# Patient Record
Sex: Male | Born: 1985 | Race: Asian | Hispanic: No | Marital: Married | Smoking: Current every day smoker
Health system: Southern US, Community
[De-identification: ages and names within clinical notes are randomized; demographics above are authoritative.]

---

## 2013-12-16 ENCOUNTER — Encounter (HOSPITAL_COMMUNITY): Payer: Self-pay | Admitting: Emergency Medicine

## 2013-12-16 ENCOUNTER — Emergency Department (HOSPITAL_COMMUNITY): Payer: Medicaid Other

## 2013-12-16 ENCOUNTER — Emergency Department (HOSPITAL_COMMUNITY)
Admission: EM | Admit: 2013-12-16 | Discharge: 2013-12-16 | Disposition: A | Discharge status: Home / Self Care | Payer: Medicaid Other | Attending: Emergency Medicine | Admitting: Emergency Medicine

## 2013-12-16 DIAGNOSIS — S61419A Laceration without foreign body of unspecified hand, initial encounter: Secondary | ICD-10-CM

## 2013-12-16 DIAGNOSIS — Y9351 Activity, roller skating (inline) and skateboarding: Secondary | ICD-10-CM | POA: Insufficient documentation

## 2013-12-16 DIAGNOSIS — W1809XA Striking against other object with subsequent fall, initial encounter: Secondary | ICD-10-CM | POA: Insufficient documentation

## 2013-12-16 DIAGNOSIS — S6990XA Unspecified injury of unspecified wrist, hand and finger(s), initial encounter: Secondary | ICD-10-CM | POA: Insufficient documentation

## 2013-12-16 DIAGNOSIS — Y929 Unspecified place or not applicable: Secondary | ICD-10-CM | POA: Insufficient documentation

## 2013-12-16 DIAGNOSIS — IMO0002 Reserved for concepts with insufficient information to code with codable children: Secondary | ICD-10-CM | POA: Insufficient documentation

## 2013-12-16 DIAGNOSIS — S61409A Unspecified open wound of unspecified hand, initial encounter: Secondary | ICD-10-CM | POA: Insufficient documentation

## 2013-12-16 DIAGNOSIS — S6992XA Unspecified injury of left wrist, hand and finger(s), initial encounter: Secondary | ICD-10-CM

## 2013-12-16 DIAGNOSIS — Z23 Encounter for immunization: Secondary | ICD-10-CM | POA: Insufficient documentation

## 2013-12-16 MED ORDER — TETANUS-DIPHTH-ACELL PERTUSSIS 5-2.5-18.5 LF-MCG/0.5 IM SUSP
0.5000 mL | Freq: Once | INTRAMUSCULAR | Status: AC
  Administered 2013-12-16: 0.5 mL via INTRAMUSCULAR
  Filled 2013-12-16: qty 0.5

## 2013-12-16 MED ORDER — LIDOCAINE-EPINEPHRINE 2 %-1:100000 IJ SOLN
20.0000 mL | Freq: Once | INTRAMUSCULAR | Status: DC
  Filled 2013-12-16: qty 20

## 2013-12-16 MED ORDER — HYDROCODONE-ACETAMINOPHEN 5-325 MG PO TABS
1.0000 | ORAL_TABLET | Freq: Once | ORAL | Status: AC
  Administered 2013-12-16: 1 via ORAL
  Filled 2013-12-16: qty 1

## 2013-12-16 MED ORDER — HYDROCODONE-ACETAMINOPHEN 5-325 MG PO TABS: 1.0000 | ORAL_TABLET | Freq: Four times a day (QID) | ORAL | Status: DC | PRN

## 2013-12-16 NOTE — ED Notes (Signed)
Patient taken to XR.

## 2013-12-16 NOTE — ED Notes (Signed)
PA at Bedside. 

## 2013-12-16 NOTE — ED Notes (Signed)
Pt was skating, tripped and fell down, top of left hand hit the pavement.  Pt is able to move hand, make fist.  Laceration and swelling to left knuckle, two sites.  Bleeding controlled.

## 2013-12-16 NOTE — Discharge Instructions (Signed)
Please allow laceration to dry for the first 12 hours. After that clean with soap and water daily. Dry and clean wound and apply Neosporin over suture line at least once daily. Take pain medication as needed. Followup at urgent care in one week for suture removal. Return sooner if you noticed any signs of infection.  Laceration Care, Adult A laceration is a cut or lesion that goes through all layers of the skin and into the tissue just beneath the skin. TREATMENT  Some lacerations may not require closure. Some lacerations may not be able to be closed due to an increased risk of infection. It is important to see your caregiver as soon as possible after an injury to minimize the risk of infection and maximize the opportunity for successful closure. If closure is appropriate, pain medicines may be given, if needed. The wound will be cleaned to help prevent infection. Your caregiver will use stitches (sutures), staples, wound glue (adhesive), or skin adhesive strips to repair the laceration. These tools bring the skin edges together to allow for faster healing and a better cosmetic outcome. However, all wounds will heal with a scar. Once the wound has healed, scarring can be minimized by covering the wound with sunscreen during the day for 1 full year. HOME CARE INSTRUCTIONS  For sutures or staples:  Keep the wound clean and dry.  If you were given a bandage (dressing), you should change it at least once a day. Also, change the dressing if it becomes wet or dirty, or as directed by your caregiver.  Wash the wound with soap and water 2 times a day. Rinse the wound off with water to remove all soap. Pat the wound dry with a clean towel.  After cleaning, apply a thin layer of the antibiotic ointment as recommended by your caregiver. This will help prevent infection and keep the dressing from sticking.  You may shower as usual after the first 24 hours. Do not soak the wound in water until the sutures are  removed.  Only take over-the-counter or prescription medicines for pain, discomfort, or fever as directed by your caregiver.  Get your sutures or staples removed as directed by your caregiver. For skin adhesive strips:  Keep the wound clean and dry.  Do not get the skin adhesive strips wet. You may bathe carefully, using caution to keep the wound dry.  If the wound gets wet, pat it dry with a clean towel.  Skin adhesive strips will fall off on their own. You may trim the strips as the wound heals. Do not remove skin adhesive strips that are still stuck to the wound. They will fall off in time. For wound adhesive:  You may briefly wet your wound in the shower or bath. Do not soak or scrub the wound. Do not swim. Avoid periods of heavy perspiration until the skin adhesive has fallen off on its own. After showering or bathing, gently pat the wound dry with a clean towel.  Do not apply liquid medicine, cream medicine, or ointment medicine to your wound while the skin adhesive is in place. This may loosen the film before your wound is healed.  If a dressing is placed over the wound, be careful not to apply tape directly over the skin adhesive. This may cause the adhesive to be pulled off before the wound is healed.  Avoid prolonged exposure to sunlight or tanning lamps while the skin adhesive is in place. Exposure to ultraviolet light in the first  year will darken the scar.  The skin adhesive will usually remain in place for 5 to 10 days, then naturally fall off the skin. Do not pick at the adhesive film. You may need a tetanus shot if:  You cannot remember when you had your last tetanus shot.  You have never had a tetanus shot. If you get a tetanus shot, your arm may swell, get red, and feel warm to the touch. This is common and not a problem. If you need a tetanus shot and you choose not to have one, there is a rare chance of getting tetanus. Sickness from tetanus can be serious. SEEK  MEDICAL CARE IF:   You have redness, swelling, or increasing pain in the wound.  You see a red line that goes away from the wound.  You have yellowish-white fluid (pus) coming from the wound.  You have a fever.  You notice a bad smell coming from the wound or dressing.  Your wound breaks open before or after sutures have been removed.  You notice something coming out of the wound such as wood or glass.  Your wound is on your hand or foot and you cannot move a finger or toe. SEEK IMMEDIATE MEDICAL CARE IF:   Your pain is not controlled with prescribed medicine.  You have severe swelling around the wound causing pain and numbness or a change in color in your arm, hand, leg, or foot.  Your wound splits open and starts bleeding.  You have worsening numbness, weakness, or loss of function of any joint around or beyond the wound.  You develop painful lumps near the wound or on the skin anywhere on your body. MAKE SURE YOU:   Understand these instructions.  Will watch your condition.  Will get help right away if you are not doing well or get worse. Document Released: 09/06/2005 Document Revised: 11/29/2011 Document Reviewed: 03/02/2011 [facility] Patient Information 2014 [place], [place].

## 2013-12-16 NOTE — ED Notes (Signed)
Placed Ice pack on left hand injury.

## 2013-12-16 NOTE — ED Provider Notes (Signed)
Medical screening examination/treatment/procedure(s) were performed by non-physician practitioner and as supervising physician I was immediately available for consultation/collaboration.   EKG Interpretation None       Elena Cothern, MD 12/16/13 1652 

## 2013-12-16 NOTE — ED Notes (Signed)
Suture cart ready at patient's room.

## 2013-12-16 NOTE — ED Provider Notes (Signed)
CSN: [id number]     Arrival date & time 12/16/13  1136 History  This chart was scribed for non-physician practitioner, Fayrene Helper, PA-C working with Doug Sou, MD by Greggory Stallion, ED scribe. This patient was seen in room TR06C/TR06C and the patient's care was started at 11:48 AM.   Chief Complaint  Patient presents with  . Hand Injury   The history is provided by the patient. No language interpreter was used.   HPI Comments: Logan Branch is a 28 y.o. male who presents to the Emergency Department complaining of left hand injury that occurred about one hour ago. He was skateboarding, fell and landed on the pavement on his left hand while it was inverted. Denies hitting his head or LOC. He has sudden onset left hand pain with associated mild swelling and abrasions. Rates pain 5/10. Pt is left hand dominant. Denies elbow pain, shoulder pain, wrist pain, knee pain, neck pain. Pt is unsure of when his last tetanus was. Denies prior injury to left hand.   No past medical history on file. No past surgical history on file. No family history on file. History  Substance Use Topics  . Smoking status: Not on file  . Smokeless tobacco: Not on file  . Alcohol Use: Not on file    Review of Systems  Musculoskeletal: Positive for arthralgias and joint swelling.  Skin: Positive for wound.  All other systems reviewed and are negative.   Allergies  Review of patient's allergies indicates not on file.  Home Medications  No current outpatient prescriptions on file.  BP 131/82  Pulse 68  Temp(Src) 98.7 F (37.1 C) (Oral)  Resp 16  SpO2 100%  Physical Exam  Nursing note and vitals reviewed. Constitutional: He is oriented to person, place, and time. He appears well-developed and well-nourished. No distress.  HENT:  Head: Normocephalic and atraumatic.  Eyes: EOM are normal.  Neck: Neck supple. No tracheal deviation present.  Cardiovascular: Normal rate.   Pulmonary/Chest: Effort normal.  No respiratory distress.  Musculoskeletal: Normal range of motion.  Left hand with three separate lacerations. First one is a 2 cm L-shaped laceration noted to the dorsum of the third MCP. Distal to that laceration is a 2 cm horizontal laceration near the MCP without joint involvement. 1 cm superficial laceration noted to the fifth MCP dorsally. No foreign object noted. Fingers with full ROM. Normal sensation. Brisk capillary refill. Pt is able to make a fist. Left wrist with normal flexion, extension, supination and pronation. No tenderness to the anatomical snuffbox.   Neurological: He is alert and oriented to person, place, and time.  Skin: Skin is warm and dry. No rash noted.  Psychiatric: He has a normal mood and affect. His behavior is normal.    ED Course  Procedures (including critical care time)  DIAGNOSTIC STUDIES: Oxygen Saturation is 100% on RA, normal by my interpretation.    COORDINATION OF CARE: 11:52 AM-Discussed treatment plan which includes xray, pain medication and laceration repair with pt at bedside and pt agreed to plan.   2:33 PM Xray neg for acute fx or fb.  Wound were irrigate thoroughly and sutured.  Appropriate wound care was discussed.  Close f/u for signs of infection.  Return in 7 days for sutures removal.  Pt request work note.    LACERATION REPAIR Performed by: Fayrene Helper Authorized byFayrene Helper Consent: Verbal consent obtained. Risks and benefits: risks, benefits and alternatives were discussed Consent given by: patient Patient identity confirmed: provided  demographic data Prepped and Draped in normal sterile fashion Wound explored  Laceration Location: 5th mcp left hand, dorsum, superficial  Laceration Length: 1cm  No Foreign Bodies seen or palpated  Anesthesia: local infiltration  Local anesthetic: lidocaine 2% w epinephrine  Anesthetic total: 1 ml  Irrigation method: syringe Amount of cleaning: standard  Skin closure: vicryl  5.0  Number of sutures: 2  Technique: simple interrupted  Patient tolerance: Patient tolerated the procedure well with no immediate complications.  LACERATION REPAIR Performed by: Fayrene HelperRAN,Dalayza Zambrana Authorized byFayrene Helper: Abdoulaye Drum Consent: Verbal consent obtained. Risks and benefits: risks, benefits and alternatives were discussed Consent given by: patient Patient identity confirmed: provided demographic data Prepped and Draped in normal sterile fashion Wound explored  Laceration Location: left hand, proximal to 3rd MCP, superficial  Laceration Length: 2cm  No Foreign Bodies seen or palpated  Anesthesia: local infiltration  Local anesthetic: lidocaine 2% w epinephrine  Anesthetic total: 2 ml  Irrigation method: syringe Amount of cleaning: standard  Skin closure: vicryl 5.0  Number of sutures: 5  Technique: simple interrupted  Patient tolerance: Patient tolerated the procedure well with no immediate complications.  LACERATION REPAIR Performed by: Fayrene HelperRAN,Karinna Beadles Authorized byFayrene Helper: Tabitha Tupper Consent: Verbal consent obtained. Risks and benefits: risks, benefits and alternatives were discussed Consent given by: patient Patient identity confirmed: provided demographic data Prepped and Draped in normal sterile fashion Wound explored  Laceration Location: left hand, distal to 3rd MCP dorsum, superficial  Laceration Length: 1cm  No Foreign Bodies seen or palpated  Anesthesia: local infiltration  Local anesthetic: lidocaine 2% w epinephrine  Anesthetic total: 2 ml  Irrigation method: syringe Amount of cleaning: standard  Skin closure: vicryl 5.0  Number of sutures: 3  Technique: simple interrupted  Patient tolerance: Patient tolerated the procedure well with no immediate complications.    Labs Review Labs Reviewed - No data to display Imaging Review Dg Hand Complete Left  12/16/2013   CLINICAL DATA:  Skateboard accident.  Hand laceration.  EXAM: LEFT HAND - COMPLETE  3+ VIEW  COMPARISON:  None.  FINDINGS: Soft tissue swelling and possible laceration over the posterior MCP region. No radiopaque foreign bodies. No underlying acute bony abnormality. No fracture, subluxation or dislocation. Joint spaces are maintained.  IMPRESSION: No acute bony abnormality.  No radiopaque foreign bodies.   Electronically Signed   By: Charlett NoseKevin  Dover M.D.   On: 12/16/2013 13:04     EKG Interpretation None      MDM   Final diagnoses:  Injury of left hand  Hand laceration    BP 131/82  Pulse 68  Temp(Src) 98.7 F (37.1 C) (Oral)  Resp 16  SpO2 100%  I have reviewed nursing notes and vital signs. I personally reviewed the imaging tests through PACS system  I reviewed available ER/hospitalization records thought the EMR   I personally performed the services described in this documentation, which was scribed in my presence. The recorded information has been reviewed and is accurate.    Fayrene HelperBowie Estefano Victory, PA-C 12/16/13 1434

## 2013-12-16 NOTE — ED Notes (Signed)
Patient soaking hand in Povidone-Iodine Solution.

## 2016-09-06 ENCOUNTER — Encounter (HOSPITAL_COMMUNITY): Payer: Self-pay

## 2016-09-06 ENCOUNTER — Emergency Department (HOSPITAL_COMMUNITY)
Admission: EM | Admit: 2016-09-06 | Discharge: 2016-09-07 | Disposition: A | Discharge status: Other Acute Inpatient Hospital | Payer: BLUE CROSS/BLUE SHIELD | Attending: Emergency Medicine | Admitting: Emergency Medicine

## 2016-09-06 DIAGNOSIS — IMO0002 Reserved for concepts with insufficient information to code with codable children: Secondary | ICD-10-CM | POA: Diagnosis present

## 2016-09-06 DIAGNOSIS — F17210 Nicotine dependence, cigarettes, uncomplicated: Secondary | ICD-10-CM | POA: Insufficient documentation

## 2016-09-06 DIAGNOSIS — F1090 Alcohol use, unspecified, uncomplicated: Secondary | ICD-10-CM | POA: Diagnosis present

## 2016-09-06 DIAGNOSIS — F332 Major depressive disorder, recurrent severe without psychotic features: Secondary | ICD-10-CM | POA: Diagnosis present

## 2016-09-06 DIAGNOSIS — F1994 Other psychoactive substance use, unspecified with psychoactive substance-induced mood disorder: Secondary | ICD-10-CM | POA: Diagnosis present

## 2016-09-06 DIAGNOSIS — F1099 Alcohol use, unspecified with unspecified alcohol-induced disorder: Secondary | ICD-10-CM | POA: Insufficient documentation

## 2016-09-06 DIAGNOSIS — F1920 Other psychoactive substance dependence, uncomplicated: Secondary | ICD-10-CM | POA: Diagnosis present

## 2016-09-06 DIAGNOSIS — F19950 Other psychoactive substance use, unspecified with psychoactive substance-induced psychotic disorder with delusions: Secondary | ICD-10-CM | POA: Diagnosis present

## 2016-09-06 LAB — COMPREHENSIVE METABOLIC PANEL
ALBUMIN: 4.5 g/dL (ref 3.5–5.0)
ALK PHOS: 84 U/L (ref 38–126)
ALT: 19 U/L (ref 17–63)
AST: 27 U/L (ref 15–41)
Anion gap: 7 (ref 5–15)
BILIRUBIN TOTAL: 0.7 mg/dL (ref 0.3–1.2)
BUN: 14 mg/dL (ref 6–20)
CO2: 27 mmol/L (ref 22–32)
Calcium: 9.2 mg/dL (ref 8.9–10.3)
Chloride: 103 mmol/L (ref 101–111)
Creatinine, Ser: 0.9 mg/dL (ref 0.61–1.24)
GFR calc Af Amer: >60 mL/min (ref >60)
GFR calc non Af Amer: >60 mL/min (ref >60)
Glucose, Bld: 84 mg/dL (ref 65–99)
POTASSIUM: 3.8 mmol/L (ref 3.5–5.1)
Sodium: 137 mmol/L (ref 135–145)
TOTAL PROTEIN: 7.4 g/dL (ref 6.5–8.1)

## 2016-09-06 LAB — CBC
HCT: 46 % (ref 39.0–52.0)
Hemoglobin: 15.7 g/dL (ref 13.0–17.0)
MCH: 26.1 pg (ref 26.0–34.0)
MCHC: 34.1 g/dL (ref 30.0–36.0)
MCV: 76.5 fL — AB (ref 78.0–100.0)
Platelets: 285 10*3/uL (ref 150–400)
RBC: 6.01 MIL/uL — ABNORMAL HIGH (ref 4.22–5.81)
RDW: 13.5 % (ref 11.5–15.5)
WBC: 8.4 10*3/uL (ref 4.0–10.5)

## 2016-09-06 LAB — SALICYLATE LEVEL: Salicylate Lvl: <7 mg/dL (ref 2.8–30.0)

## 2016-09-06 LAB — ACETAMINOPHEN LEVEL: Acetaminophen (Tylenol), Serum: <10 ug/mL — ABNORMAL LOW (ref 10–30)

## 2016-09-06 LAB — ETHANOL: Alcohol, Ethyl (B): 21 mg/dL — ABNORMAL HIGH (ref <5)

## 2016-09-06 NOTE — ED Notes (Signed)
Patient belonging locked in locker 28, jacket , shirt , socks , shoe , pants , wallet
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
## 2016-09-06 NOTE — BH Assessment (Signed)
[facility] Assessment Progress Note   Could not get patient to wake up for assessment.  Will attempt again later.
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
## 2016-09-06 NOTE — ED Notes (Signed)
MD in Room with patient

## 2016-09-06 NOTE — Progress Notes (Signed)
Patient listed as having Medicaid insurance without a pcp. Patient has Medicaid [employer] insurance.  This insurance does not cover the cost of medications.  The following resources were placed in patient's belongings bag in locker #28:    [facility] with [facility], informed patient of services there  [facility] also provided patient with list of pcps who accept self pay patients, list of discount pharmacies and websites needymeds.org and GoodRX.com for medication assistance, phone number to inquire about the orange card, phone number to inquire about Medicaid, phone number to inquire about the Affordable Care Act, financial resources in the community such as local churches, [employer], urban ministries, and dental assistance for uninsured patients.  No further EDCM needs at this time.
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
## 2016-09-07 ENCOUNTER — Inpatient Hospital Stay (HOSPITAL_COMMUNITY)
Admission: AD | Admit: 2016-09-07 | Discharge: 2016-09-14 | DRG: 897 | Disposition: A | Discharge status: Home / Self Care | Payer: BLUE CROSS/BLUE SHIELD | Attending: Psychiatry | Admitting: Psychiatry

## 2016-09-07 ENCOUNTER — Encounter (HOSPITAL_COMMUNITY): Payer: Self-pay | Admitting: *Deleted

## 2016-09-07 DIAGNOSIS — R45850 Homicidal ideations: Secondary | ICD-10-CM | POA: Diagnosis present

## 2016-09-07 DIAGNOSIS — F332 Major depressive disorder, recurrent severe without psychotic features: Secondary | ICD-10-CM | POA: Diagnosis present

## 2016-09-07 DIAGNOSIS — F411 Generalized anxiety disorder: Secondary | ICD-10-CM | POA: Diagnosis present

## 2016-09-07 DIAGNOSIS — IMO0002 Reserved for concepts with insufficient information to code with codable children: Secondary | ICD-10-CM | POA: Diagnosis present

## 2016-09-07 DIAGNOSIS — F1290 Cannabis use, unspecified, uncomplicated: Secondary | ICD-10-CM | POA: Diagnosis present

## 2016-09-07 DIAGNOSIS — F17210 Nicotine dependence, cigarettes, uncomplicated: Secondary | ICD-10-CM | POA: Diagnosis present

## 2016-09-07 DIAGNOSIS — F1920 Other psychoactive substance dependence, uncomplicated: Secondary | ICD-10-CM | POA: Diagnosis present

## 2016-09-07 DIAGNOSIS — F19950 Other psychoactive substance use, unspecified with psychoactive substance-induced psychotic disorder with delusions: Secondary | ICD-10-CM | POA: Diagnosis present

## 2016-09-07 DIAGNOSIS — Z79899 Other long term (current) drug therapy: Secondary | ICD-10-CM | POA: Diagnosis not present

## 2016-09-07 DIAGNOSIS — F1099 Alcohol use, unspecified with unspecified alcohol-induced disorder: Secondary | ICD-10-CM | POA: Diagnosis present

## 2016-09-07 DIAGNOSIS — F1090 Alcohol use, unspecified, uncomplicated: Secondary | ICD-10-CM | POA: Diagnosis present

## 2016-09-07 DIAGNOSIS — F0630 Mood disorder due to known physiological condition, unspecified: Secondary | ICD-10-CM

## 2016-09-07 DIAGNOSIS — F1994 Other psychoactive substance use, unspecified with psychoactive substance-induced mood disorder: Secondary | ICD-10-CM | POA: Diagnosis present

## 2016-09-07 LAB — RAPID URINE DRUG SCREEN, HOSP PERFORMED
Amphetamines: NOT DETECTED
BARBITURATES: NOT DETECTED
BENZODIAZEPINES: NOT DETECTED
Cocaine: NOT DETECTED
Opiates: NOT DETECTED
Tetrahydrocannabinol: NOT DETECTED

## 2016-09-07 MED ORDER — ONDANSETRON HCL 4 MG PO TABS: 4.0000 mg | ORAL_TABLET | Freq: Three times a day (TID) | ORAL | Status: DC | PRN

## 2016-09-07 MED ORDER — IBUPROFEN 600 MG PO TABS: 600.0000 mg | ORAL_TABLET | Freq: Three times a day (TID) | ORAL | Status: DC | PRN

## 2016-09-07 MED ORDER — OLANZAPINE 5 MG PO TABS
5.0000 mg | ORAL_TABLET | Freq: Every day | ORAL | Status: DC
  Filled 2016-09-07: qty 1

## 2016-09-07 MED ORDER — OLANZAPINE 5 MG PO TABS
5.0000 mg | ORAL_TABLET | Freq: Every day | ORAL | Status: DC
  Administered 2016-09-07: 5 mg via ORAL
  Filled 2016-09-07 (×3): qty 1

## 2016-09-07 MED ORDER — ALUM & MAG HYDROXIDE-SIMETH 200-200-20 MG/5ML PO SUSP
30.0000 mL | ORAL | Status: DC | PRN
  Administered 2016-09-12: 30 mL via ORAL
  Filled 2016-09-07: qty 30

## 2016-09-07 MED ORDER — ACETAMINOPHEN 325 MG PO TABS
650.0000 mg | ORAL_TABLET | Freq: Four times a day (QID) | ORAL | Status: DC | PRN
  Administered 2016-09-10: 650 mg via ORAL
  Filled 2016-09-07: qty 2

## 2016-09-07 MED ORDER — MAGNESIUM HYDROXIDE 400 MG/5ML PO SUSP: 30.0000 mL | Freq: Every day | ORAL | Status: DC | PRN

## 2016-09-07 MED ORDER — ZOLPIDEM TARTRATE 5 MG PO TABS: 5.0000 mg | ORAL_TABLET | Freq: Every evening | ORAL | Status: DC | PRN

## 2016-09-07 MED ORDER — GABAPENTIN 100 MG PO CAPS
200.0000 mg | ORAL_CAPSULE | Freq: Two times a day (BID) | ORAL | Status: DC
  Administered 2016-09-07: 200 mg via ORAL
  Filled 2016-09-07: qty 2

## 2016-09-07 MED ORDER — NICOTINE 7 MG/24HR TD PT24
7.0000 mg | MEDICATED_PATCH | Freq: Every day | TRANSDERMAL | Status: DC
  Administered 2016-09-07: 7 mg via TRANSDERMAL
  Filled 2016-09-07: qty 1

## 2016-09-07 MED ORDER — GABAPENTIN 100 MG PO CAPS
200.0000 mg | ORAL_CAPSULE | Freq: Two times a day (BID) | ORAL | Status: DC
  Administered 2016-09-07: 200 mg via ORAL
  Filled 2016-09-07 (×5): qty 2

## 2016-09-07 MED ORDER — ALUM & MAG HYDROXIDE-SIMETH 200-200-20 MG/5ML PO SUSP: 30.0000 mL | ORAL | Status: DC | PRN

## 2016-09-07 MED ORDER — LORAZEPAM 1 MG PO TABS: 1.0000 mg | ORAL_TABLET | Freq: Three times a day (TID) | ORAL | Status: DC | PRN

## 2016-09-07 MED ORDER — ONDANSETRON HCL 4 MG PO TABS
4.0000 mg | ORAL_TABLET | Freq: Three times a day (TID) | ORAL | Status: DC | PRN
Start: 2016-09-07 — End: 2016-09-14

## 2016-09-07 MED ORDER — IBUPROFEN 200 MG PO TABS: 600.0000 mg | ORAL_TABLET | Freq: Three times a day (TID) | ORAL | Status: DC | PRN

## 2016-09-07 MED ORDER — ACETAMINOPHEN 325 MG PO TABS: 650.0000 mg | ORAL_TABLET | ORAL | Status: DC | PRN

## 2016-09-07 NOTE — Consult Note (Signed)
[place] Psychiatry Consult   Reason for Consult:  Psychiatric evaluation Referring Physician:  EDP Patient Identification: Logan Branch MRN:  [id number] Principal Diagnosis: Substance-induced psychotic disorder with delusions [facility]) Diagnosis:   Patient Active Problem List   Diagnosis Date Noted  . Substance-induced psychotic disorder with delusions [facility]) [F19.950] 09/07/2016    Priority: High  . Substance induced mood disorder ([place]) [F19.94] 09/07/2016    Priority: High  . Alcohol use disorder ([place]) [F10.99] 09/07/2016  . Polysubstance (excluding opioids) dependence ([place]) [F19.20] 09/07/2016    Total Time spent with patient: 45 minutes  Subjective:   Logan Branch is a 30 y.o. male patient admitted with psychosis and aggressive behavior.  HPI:  Patient denies prior history of mental illness but reports 10 year history of Marijuana/Alcohol use and 3 months history of Methamphetamine abuse. He was brought to [facility] under IVC paper activated by his father in law after he became hostile, aggressive, paranoid and hearing voices telling him to kill his stepfather and mother. Patient reports inability to control his behavior when he is under the influence of drugs. He states that he is paranoid and hears voices whenever he is "high" on Methamphetamine. Patient reports that he used Meth and THC few days ago but drank some alcohol yesterday.   Past Psychiatric History: denies  Risk to Self: Suicidal Ideation: No Suicidal Intent: No Is patient at risk for suicide?: No Suicidal Plan?: No Access to Means: No What has been your use of drugs/alcohol within the last 12 months?: Meth, marijuana How many times?: 0 Other Self Harm Risks: None Triggers for Past Attempts: None known Intentional Self Injurious Behavior: None Risk to Others: Homicidal Ideation: No (Pt denies) Thoughts of Harm to Others: No Current Homicidal Intent: No Current Homicidal Plan: No Access to Homicidal Means:  Yes Describe Access to Homicidal Means: Pt says he has a couple guns. Identified Victim: No one per patient History of harm to others?: No Assessment of Violence: None Noted Violent Behavior Description: None Does patient have access to weapons?: Yes (Comment) (2 rifles, one shot gun at the home.) Criminal Charges Pending?: No Does patient have a court date: No Prior Inpatient Therapy: Prior Inpatient Therapy: No Prior Therapy Dates: None Prior Therapy Facilty/Provider(s): None Reason for Treatment: None Prior Outpatient Therapy: Prior Outpatient Therapy: No Prior Therapy Dates: None Prior Therapy Facilty/Provider(s): None Reason for Treatment: None Does patient have an ACCT team?: No Does patient have Intensive In-House Services?  : No Does patient have [facility] services? : No Does patient have P4CC services?: No  Past Medical History: History reviewed. No pertinent past medical history. History reviewed. No pertinent surgical history. Family History: History reviewed. No pertinent family history. Family Psychiatric  History:  Social History:  History  Alcohol Use  . Yes     History  Drug Use  . Types: Marijuana, Methamphetamines    Social History   Social History  . Marital status: Married    Spouse name: N/A  . Number of children: N/A  . Years of education: N/A   Social History Main Topics  . Smoking status: Current Every Day Smoker    Packs/day: 0.50    Types: Cigarettes  . Smokeless tobacco: Never Used  . Alcohol use Yes  . Drug use:     Types: Marijuana, Methamphetamines  . Sexual activity: Not Asked   Other Topics Concern  . None   Social History Narrative  . None   Additional Social History:  Allergies:  No Known Allergies  Labs:  Results for orders placed or performed during the hospital encounter of 09/06/16 (from the past 48 hour(s))  Comprehensive metabolic panel     Status: None   Collection Time: 09/06/16  9:41 PM  Result Value Ref  Range   Sodium 137 135 - 145 mmol/L   Potassium 3.8 3.5 - 5.1 mmol/L   Chloride 103 101 - 111 mmol/L   CO2 27 22 - 32 mmol/L   Glucose, Bld 84 65 - 99 mg/dL   BUN 14 6 - 20 mg/dL   Creatinine, Ser 0.90 0.61 - 1.24 mg/dL   Calcium 9.2 8.9 - 10.3 mg/dL   Total Protein 7.4 6.5 - 8.1 g/dL   Albumin 4.5 3.5 - 5.0 g/dL   AST 27 15 - 41 U/L   ALT 19 17 - 63 U/L   Alkaline Phosphatase 84 38 - 126 U/L   Total Bilirubin 0.7 0.3 - 1.2 mg/dL   GFR calc non Af Amer >60 >60 mL/min   GFR calc Af Amer >60 >60 mL/min    Comment: (NOTE) The eGFR has been calculated using the CKD EPI equation. This calculation has not been validated in all clinical situations. eGFR's persistently <60 mL/min signify possible Chronic Kidney Disease.    Anion gap 7 5 - 15  Ethanol     Status: Abnormal   Collection Time: 09/06/16  9:41 PM  Result Value Ref Range   Alcohol, Ethyl (B) 21 (H) <5 mg/dL    Comment:        LOWEST DETECTABLE LIMIT FOR SERUM ALCOHOL IS 5 mg/dL FOR MEDICAL PURPOSES ONLY   Salicylate level     Status: None   Collection Time: 09/06/16  9:41 PM  Result Value Ref Range   Salicylate Lvl <1.7 2.8 - 30.0 mg/dL  Acetaminophen level     Status: Abnormal   Collection Time: 09/06/16  9:41 PM  Result Value Ref Range   Acetaminophen (Tylenol), Serum <10 (L) 10 - 30 ug/mL    Comment:        THERAPEUTIC CONCENTRATIONS VARY SIGNIFICANTLY. A RANGE OF 10-30 ug/mL MAY BE AN EFFECTIVE CONCENTRATION FOR MANY PATIENTS. HOWEVER, SOME ARE BEST TREATED AT CONCENTRATIONS OUTSIDE THIS RANGE. ACETAMINOPHEN CONCENTRATIONS >150 ug/mL AT 4 HOURS AFTER INGESTION AND >50 ug/mL AT 12 HOURS AFTER INGESTION ARE OFTEN ASSOCIATED WITH TOXIC REACTIONS.   cbc     Status: Abnormal   Collection Time: 09/06/16  9:41 PM  Result Value Ref Range   WBC 8.4 4.0 - 10.5 K/uL   RBC 6.01 (H) 4.22 - 5.81 MIL/uL   Hemoglobin 15.7 13.0 - 17.0 g/dL   HCT 46.0 39.0 - 52.0 %   MCV 76.5 (L) 78.0 - 100.0 fL   MCH 26.1 26.0 -  34.0 pg   MCHC 34.1 30.0 - 36.0 g/dL   RDW 13.5 11.5 - 15.5 %   Platelets 285 150 - 400 K/uL  Rapid urine drug screen (hospital performed)     Status: None   Collection Time: 09/07/16  2:51 AM  Result Value Ref Range   Opiates NONE DETECTED NONE DETECTED   Cocaine NONE DETECTED NONE DETECTED   Benzodiazepines NONE DETECTED NONE DETECTED   Amphetamines NONE DETECTED NONE DETECTED   Tetrahydrocannabinol NONE DETECTED NONE DETECTED   Barbiturates NONE DETECTED NONE DETECTED    Comment:        DRUG SCREEN FOR MEDICAL PURPOSES ONLY.  IF CONFIRMATION IS NEEDED FOR ANY PURPOSE, NOTIFY LAB  WITHIN 5 DAYS.        LOWEST DETECTABLE LIMITS FOR URINE DRUG SCREEN Drug Class       Cutoff (ng/mL) Amphetamine      1000 Barbiturate      200 Benzodiazepine   458 Tricyclics       099 Opiates          300 Cocaine          300 THC              50     Current Facility-Administered Medications  Medication Dose Route Frequency Provider Last Rate Last Dose  . alum & mag hydroxide-simeth (MAALOX/MYLANTA) 200-200-20 MG/5ML suspension 30 mL  30 mL Oral PRN Delora Fuel, MD      . gabapentin (NEURONTIN) capsule 200 mg  200 mg Oral BID Corena Pilgrim, MD      . ibuprofen (ADVIL,MOTRIN) tablet 600 mg  600 mg Oral I3J PRN Delora Fuel, MD      . nicotine (NICODERM CQ - dosed in mg/24 hr) patch 7 mg  7 mg Transdermal Daily Delora Fuel, MD      . OLANZapine [facility]) tablet 5 mg  5 mg Oral QHS Kross Swallows, MD      . ondansetron (ZOFRAN) tablet 4 mg  4 mg Oral A2N PRN Delora Fuel, MD       No current outpatient prescriptions on file.    Musculoskeletal: Strength & Muscle Tone: within normal limits Gait & Station: normal Patient leans: N/A  Psychiatric Specialty Exam: Physical Exam  Psychiatric: His speech is normal. His mood appears anxious. He is agitated, aggressive and actively hallucinating. Thought content is paranoid and delusional. Cognition and memory are normal. He expresses impulsivity.  He expresses homicidal ideation.    Review of Systems  Constitutional: Negative.   HENT: Negative.   Eyes: Negative.   Respiratory: Negative.   Cardiovascular: Negative.   Gastrointestinal: Negative.   Genitourinary: Negative.   Musculoskeletal: Negative.   Skin: Negative.   Neurological: Negative.   Endo/Heme/Allergies: Negative.   Psychiatric/Behavioral: Positive for hallucinations and substance abuse. The patient has insomnia.     Blood pressure 107/70, pulse 87, temperature 97.9 F (36.6 C), temperature source Oral, resp. rate 19, height _0  (1.676 m), weight 54.4 kg (120 lb), SpO2 98 %.Body mass index is 19.37 kg/m.  General Appearance: Casual  Eye Contact:  Good  Speech:  Clear and Coherent  Volume:  Increased  Mood:  Irritable  Affect:  Labile  Thought Process:  Coherent and Descriptions of Associations: Intact  Orientation:  Full (Time, Place, and Person)  Thought Content:  Delusions and Hallucinations: Auditory  Suicidal Thoughts:  No  Homicidal Thoughts:  No  Memory:  Immediate;   Fair Recent;   Fair Remote;   Good  Judgement:  Poor  Insight:  Lacking  Psychomotor Activity:  Increased  Concentration:  Concentration: Fair and Attention Span: Fair  Recall:  [employer] of Knowledge:  Good  Language:  Good  Akathisia:  No  Handed:  Right  AIMS (if indicated):     Assets:  Communication Skills Social Support  ADL's:  Intact  Cognition:  WNL  Sleep:   poor     Treatment Plan Summary: Daily contact with patient to assess and evaluate symptoms and progress in treatment and Medication management  Start Gabapentin 215m bid for aggression/mood lability. Start Zyprexa 5 mg qhs for psychosis/delusions.  Disposition: Recommend psychiatric Inpatient admission when medically cleared. Supportive therapy  provided about ongoing stressors.  Corena Pilgrim, MD 09/07/2016 10:17 AM

## 2016-09-07 NOTE — ED Notes (Signed)
[employer] transport requested. 

## 2016-09-07 NOTE — BH Assessment (Signed)
[facility] Assessment Progress Note  Per Thedore MinsMojeed Akintayo, MD, this pt requires psychiatric hospitalization.  Berneice Heinrichina Tate, RN, [facility]  Pt presents under IVC which Dr Jannifer FranklinAkintayo has upheld, and [facility].  Pt's nurse, Morrie Sheldonshley, has been notified, and agrees to call report to [phone].  Pt is to be transported via [employer] examinerlaw enforcement.   Doylene Canninghomas Yanni Quiroa, MA Triage Specialist [phone]

## 2016-09-07 NOTE — Progress Notes (Signed)
Admission Note:  30 year old male who presents IVC, in no acute distress, for the treatment of paranoia, psychosis, and substance abuse. Patient reports "2 weeks ago I was smoking meth and can't sleep. I started hearing my mother and father in-laws voice trying to kill me. I was scary scary scary for a week thinking they were trying to kill me.  I grabbed a gun and held to my chest while I sleep because I was scared but then my mother and father in law were scared of me".   Patient appears anxious. Patient was cooperative with admission process. Patient currently denies SI and contracts for safety upon admission. Patient denies current AVH.  Patient reports that he is "back to normal. Don't hear no voices. Not scared anymore".  Patient reports that he has slept good for "2-3 days".  Patient is focused on discharge and would like to leave tomorrow to return home to his wife and kids ages 89 yo and 2 yo.  Patient reports hx of meth use and marijuana use.  Patient currently lives with his in-laws, wife, and children.  Patient identifies his wife as his support system.  Patient reports that his wife is pregnant with their third child.  [facility], patient would like to "Get out of here" and "Be better and don't use anymore drugs and alcohol". Patient states "This first and last time here".  Skin was assessed and found to be clear of any abnormal marks apart from a healing scar on right thumb.  Patient searched and no contraband found, POC and unit policies explained and understanding verbalized. Consents obtained. Patient had no additional questions or concerns.

## 2016-09-07 NOTE — ED Notes (Signed)
Pt transferred from TCU, presents under IVC, by family after argument with wife, pt reports. Denies SI, HI.  Reports hearing voices. A&O x 3, no distress noted, calm & cooperative.  Monitoring for safety, Q 15 min checks in effect.  Admits to marijuana use and 1 can of alcoholic drink.  SAFETY CHECK FOR CONTRABAND COMPLETED, NO ITEMS FOUND.
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
## 2016-09-07 NOTE — ED Notes (Signed)
[facility] RN Otto Herbreka[facility].  Pending transport in 1 hour. Pt is IVC, [employer] transport.

## 2016-09-07 NOTE — ED Notes (Signed)
Pt A&O x 3, no distress noted,calm & cooperative.  [facility].  Monitoring for safety, Q 15 min checks in effect.

## 2016-09-07 NOTE — Progress Notes (Signed)
09/07/16 1341:Pt was sleep during recreation time.   Caroll RancherMarjette Caleigh Rabelo, LRT/CTRS

## 2016-09-07 NOTE — ED Notes (Signed)
Pt behavior cooperative, denies SI/HI. Pt reports he hears voices when he is high. Special checks q 15 mins in place for safety. Video monitoring in place. Will continue to monitor.

## 2016-09-07 NOTE — Tx Team (Signed)
Initial Treatment Plan 09/07/2016 11:24 PM [name] ZOX:[id number]    PATIENT STRESSORS: Substance abuse   PATIENT STRENGTHS: Ability for insight Average or above average intelligence Physical Health Supportive family/friends   PATIENT IDENTIFIED PROBLEMS: Substance Abuse  Psychosis  "To get out of here"  "Be better and don't use anymore drugs and alcohol"               DISCHARGE CRITERIA:  Ability to meet basic life and health needs Improved stabilization in mood, thinking, and/or behavior Motivation to continue treatment in a less acute level of care Need for constant or close observation no longer present  PRELIMINARY DISCHARGE PLAN: Outpatient therapy Return to previous living arrangement Return to previous work or school arrangements  PATIENT/FAMILY INVOLVEMENT: This treatment plan has been presented to and reviewed with the patient, Logan Branch.  The patient and family have been given the opportunity to ask questions and make suggestions.  [name], [name], RN 09/07/2016, 11:24 PM

## 2016-09-08 MED ORDER — OLANZAPINE 2.5 MG PO TABS
2.5000 mg | ORAL_TABLET | Freq: Every day | ORAL | Status: DC
  Administered 2016-09-08 – 2016-09-09 (×2): 2.5 mg via ORAL
  Filled 2016-09-08 (×4): qty 1

## 2016-09-08 MED ORDER — NICOTINE POLACRILEX 2 MG MT GUM
2.0000 mg | CHEWING_GUM | OROMUCOSAL | Status: DC | PRN
  Administered 2016-09-09 – 2016-09-12 (×8): 2 mg via ORAL
  Filled 2016-09-08 (×6): qty 1

## 2016-09-08 NOTE — Progress Notes (Signed)
Patient ID: Logan Branch, male   DOB: 14-Jun-1986, 30 y.o.   MRN: [id number]  Pt currently presents with an anxios affect and assertive behavior. Pt reports to writer that their goal is to "take something for sleep." Pt states "I am back to normal now that I don't have the drugs anymore." Pt reports poor sleep before tonight. Pt states "I was not trying to harm anyone or myself, I was holding the knife across my chest in case someone came in my room."  Pt provided with medications per providers orders. Pt's labs and vitals were monitored throughout the night. Pt supported emotionally and encouraged to express concerns and questions. Pt educated on medications.  Pt's safety ensured with 15 minute and environmental checks. Pt currently denies SI/HI and A/V hallucinations. Pt verbally agrees to seek staff if SI/HI or A/VH occurs and to consult with staff before acting on any harmful thoughts. Will continue POC.

## 2016-09-08 NOTE — Progress Notes (Signed)
Recreation Therapy Notes  Date:09/08/16 Time:0930 Location: [address] Group Room  Group Topic: Stress Management  Goal Area(s) Addresses:  Patient will verbalize importance of using healthy stress management.  Patient will identify positive emotions associated with healthy stress management.   Intervention: Guided Imagery  Activity: Rainforest Imagery. LRT introduced the stress management technique of guided imagery. LRT read a script that took patients on a journey through the rainforest to allow patients to get the feeling of being in the middle of a rainforest. Patients were to follow along as LRT read the script to engage in technique.   Education:Stress Management, Discharge Planning.   Education Outcome:Acknowledges edcuation/In group clarification offered/Needs additional education  Clinical Observations/Feedback:Pt did not attend group.   Mckinzy Fuller, LRT/CTRS        Peighton Mehra A 09/08/2016 11:52 AM 

## 2016-09-08 NOTE — Progress Notes (Signed)
Patient attended N/A group meeting tonight. 

## 2016-09-08 NOTE — H&P (Signed)
Psychiatric Admission Assessment Adult  Patient Identification: Logan Branch MRN:  [id number] Date of Evaluation:  09/08/2016 Chief Complaint:  substance induced psychotic disorder with delusions Principal Diagnosis: Substance-induced psychotic disorder with delusions (HCC) Diagnosis:   Patient Active Problem List   Diagnosis Date Noted  . Alcohol use disorder (HCC) [F10.99] 09/07/2016  . Polysubstance (excluding opioids) dependence (HCC) [F19.20] 09/07/2016  . 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   Patient states that he is here after argument with his wife and parents. He states that they were "scared" as he was using drug. He adamantly denies SI/HI at that time, but reports he was holding a gun, being scared that somebody might kil him. He had AH of his father in law trying to kill him. He believes that he was under the influence of drug, and denies any paranoia, AH since then. He feels that he is fine now and wants to go back to home to be with his family and apologize to them. Although he reports good relationship with them, he talks about his frustration that his wife was not doing house chores, although she is a "house keeper." He states that these stress led him to use drugs. He drinks one to three beers every day, and might drink more at party. He uses marijuana every day. He used methamphetamine for the past month; realized that his paranoia is getting worse. He is hoping to get abstinent from these drugs. Patient is interested in outpatient treatment.   He denies insomnia, or anhedonia. He denies SI. He denies paranoia, AH/VH. He denies decreased need for sleep.   UDS negative on 12/19,  EtOH 21 12/18  Associated Signs/Symptoms: Depression Symptoms:   denies (Hypo) Manic Symptoms:  denies Anxiety Symptoms:  denies Psychotic Symptoms:  denies PTSD Symptoms: denies Total Time spent with patient: 45 minutes  Past Psychiatric History:  Outpatient: denies Psychiatry admission: denies  Previous suicide attempt: denies Past trials of medication: denies History of violence: denies  Is the patient at risk to self? No.  Has the patient been a risk to self in the past 6 months? No.  Has the patient been a risk to self within the distant past? No.  Is the patient a risk to others? No.  Has the patient been a risk to others in the past 6 months? Yes.    Has the patient been a risk to others within the distant past? No.   Prior Inpatient Therapy:  denies Prior Outpatient Therapy:  denies  Alcohol Screening: 1. How often do you have a drink containing alcohol?: 4 or more times a week 2. How many drinks containing alcohol do you have on a typical day when you are drinking?: 1 or 2 3. How often do you have six or more drinks on one occasion?: Never Preliminary Score: 0 9. Have you or someone else been injured as a result of your drinking?: No 10. Has a relative or friend or a doctor or another health worker been concerned about your drinking or suggested you cut down?: No Alcohol Use Disorder Identification Test Final Score (AUDIT): 4 Brief Intervention: AUDIT score less than 7 or less-screening does not suggest unhealthy drinking-brief intervention not indicated Substance Abuse History in the last 12 months:  Yes.     Consequences of Substance Abuse: threatening SI, HI Previous Psychotropic Medications: No  Psychological Evaluations: No  Past Medical History: History reviewed. No pertinent past medical history. History reviewed. No pertinent surgical history. Family History: History reviewed. No pertinent family history. Family Psychiatric  History: denies Tobacco Screening: Have you used any form of tobacco in the last 30 days?  (Cigarettes, Smokeless Tobacco, Cigars, and/or Pipes): Yes Tobacco use, Select all that apply: 4 or less cigarettes per day Are you interested in Tobacco Cessation Medications?: No, patient refused Counseled patient on smoking cessation including recognizing danger situations, developing coping skills and basic information about quitting provided: Refused/Declined practical counseling Social History:  History  Alcohol Use  . Yes     History  Drug Use  . Types: Marijuana, Methamphetamines    Additional Social History:       Work: machine operator x 2 years,  Legal: denies  Originally from [place] since 10/2010    Patient lives with his mother and father in law  He is married of   Wife of five years 2 children (2,4)                Allergies:  No Known Allergies Lab Results:  Results for orders placed or performed during the hospital encounter of 09/06/16 (from the past 48 hour(s))  Comprehensive metabolic panel     Status: None   Collection Time: 09/06/16  9:41 PM  Result Value Ref Range   Sodium 137 135 - 145 mmol/L   Potassium 3.8 3.5 - 5.1 mmol/L   Chloride 103 101 - 111 mmol/L   CO2 27 22 - 32 mmol/L   Glucose, Bld 84 65 - 99 mg/dL   BUN 14 6 - 20 mg/dL   Creatinine, Ser 0.90 0.61 - 1.24 mg/dL   Calcium 9.2 8.9 - 10.3 mg/dL   Total Protein 7.4 6.5 - 8.1 g/dL   Albumin 4.5 3.5 - 5.0 g/dL   AST 27 15 - 41 U/L   ALT 19 17 - 63 U/L   Alkaline Phosphatase 84 38 - 126 U/L   Total Bilirubin 0.7 0.3 - 1.2 mg/dL   GFR calc non Af Amer >60 >60 mL/min   GFR calc Af Amer >60 >60 mL/min    Comment: (NOTE) The eGFR has been calculated using the CKD EPI equation. This calculation has not been validated in all clinical situations. eGFR's persistently <60 mL/min signify possible Chronic Kidney Disease.    Anion gap 7 5 - 15  Ethanol     Status: Abnormal   Collection Time: 09/06/16  9:41 PM  Result Value Ref Range   Alcohol, Ethyl (B) 21 (H) <5 mg/dL    Comment:         LOWEST DETECTABLE LIMIT FOR SERUM ALCOHOL IS 5 mg/dL FOR MEDICAL PURPOSES ONLY   Salicylate level     Status: None   Collection Time: 09/06/16  9:41 PM  Result Value Ref Range   Salicylate Lvl <7.0 2.8 - 30.0 mg/dL  Acetaminophen level     Status: Abnormal   Collection Time: 09/06/16  9:41 PM  Result Value Ref Range   Acetaminophen (Tylenol), Serum <10 (L) 10 - 30 ug/mL    Comment:        THERAPEUTIC CONCENTRATIONS VARY SIGNIFICANTLY. A RANGE OF 10-30 ug/mL MAY BE AN EFFECTIVE CONCENTRATION FOR MANY PATIENTS. HOWEVER, SOME ARE BEST TREATED AT CONCENTRATIONS OUTSIDE THIS RANGE. ACETAMINOPHEN CONCENTRATIONS >150 ug/mL AT 4 HOURS AFTER INGESTION AND >50 ug/mL AT 12   HOURS AFTER INGESTION ARE OFTEN ASSOCIATED WITH TOXIC REACTIONS.   cbc     Status: Abnormal   Collection Time: 09/06/16  9:41 PM  Result Value Ref Range   WBC 8.4 4.0 - 10.5 K/uL   RBC 6.01 (H) 4.22 - 5.81 MIL/uL   Hemoglobin 15.7 13.0 - 17.0 g/dL   HCT 46.0 39.0 - 52.0 %   MCV 76.5 (L) 78.0 - 100.0 fL   MCH 26.1 26.0 - 34.0 pg   MCHC 34.1 30.0 - 36.0 g/dL   RDW 13.5 11.5 - 15.5 %   Platelets 285 150 - 400 K/uL  Rapid urine drug screen (hospital performed)     Status: None   Collection Time: 09/07/16  2:51 AM  Result Value Ref Range   Opiates NONE DETECTED NONE DETECTED   Cocaine NONE DETECTED NONE DETECTED   Benzodiazepines NONE DETECTED NONE DETECTED   Amphetamines NONE DETECTED NONE DETECTED   Tetrahydrocannabinol NONE DETECTED NONE DETECTED   Barbiturates NONE DETECTED NONE DETECTED    Comment:        DRUG SCREEN FOR MEDICAL PURPOSES ONLY.  IF CONFIRMATION IS NEEDED FOR ANY PURPOSE, NOTIFY LAB WITHIN 5 DAYS.        LOWEST DETECTABLE LIMITS FOR URINE DRUG SCREEN Drug Class       Cutoff (ng/mL) Amphetamine      1000 Barbiturate      200 Benzodiazepine   161 Tricyclics       096 Opiates          300 Cocaine          300 THC              50     Blood Alcohol level:  Lab Results  Component  Value Date   ETH 21 (H) 04/54/0981    Metabolic Disorder Labs:  No results found for: HGBA1C, MPG No results found for: PROLACTIN No results found for: CHOL, TRIG, HDL, CHOLHDL, VLDL, LDLCALC  Current Medications: Current Facility-Administered Medications  Medication Dose Route Frequency Provider Last Rate Last Dose  . acetaminophen (TYLENOL) tablet 650 mg  650 mg Oral Q6H PRN Patrecia Pour, NP      . alum & mag hydroxide-simeth (MAALOX/MYLANTA) 200-200-20 MG/5ML suspension 30 mL  30 mL Oral PRN Patrecia Pour, NP      . ibuprofen (ADVIL,MOTRIN) tablet 600 mg  600 mg Oral Q8H PRN Patrecia Pour, NP      . magnesium hydroxide (MILK OF MAGNESIA) suspension 30 mL  30 mL Oral Daily PRN Patrecia Pour, NP      . OLANZapine (ZYPREXA) tablet 2.5 mg  2.5 mg Oral QHS Norman Clay, MD      . ondansetron (ZOFRAN) tablet 4 mg  4 mg Oral Q8H PRN Patrecia Pour, NP       PTA Medications: No prescriptions prior to admission.    Musculoskeletal: Strength & Muscle Tone: within normal limits Gait & Station: normal Patient leans: N/A  Psychiatric Specialty Exam: Physical Exam  Nursing note and vitals reviewed. Constitutional: He is oriented to person, place, and time.  Neurological: He is alert and oriented to person, place, and time.  No tremors    Review of Systems  Psychiatric/Behavioral: Negative for depression, hallucinations, substance abuse and suicidal ideas. The patient is not nervous/anxious and does not have insomnia.   All other systems reviewed and are negative.   Blood pressure (!) 104/53, pulse (!) 128, temperature 98.8 F (37.1 C), temperature  source Oral, resp. rate 16, height 5' 6" (1.676 m), weight 123 lb (55.8 kg).Body mass index is 19.85 kg/m.  General Appearance: Fairly Groomed  Eye Contact:  Good  Speech:  Clear and Coherent  Volume:  Normal  Mood:  good  Affect:  Restricted  Thought Process:  Coherent and Goal Directed  Orientation:  Full (Time, Place, and  Person)  Thought Content:  Logical Perceptions: denies AH/VH  Suicidal Thoughts:  No  Homicidal Thoughts:  No  Memory:  Immediate;   Good Recent;   Good Remote;   Good  Judgement:  Fair  Insight:  Fair  Psychomotor Activity:  Normal  Concentration:  Concentration: Good and Attention Span: Good  Recall:  Good  Fund of Knowledge:  Good  Language:  Good  Akathisia:  No  Handed:  Right  AIMS (if indicated):     Assets:  Communication Skills Desire for Improvement  ADL's:  Intact  Cognition:  WNL  Sleep:  Number of Hours: 6.25   Assessment Lori Chihuahua is a 30 year old male with no psychiatry diagnosis, who was admitted under IVC with concern for threatening SI and kill his in laws in the setting of alcohol, marijuana and methamphetamine use.   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 He adamantly denies any SI/HI although he admits holding a gun to "protect" himself due to his paranoia. Patient is currently future oriented. It is likely that his substance use plays significant role into his recent behavior; will plan to taper off Zyprexa. Will need to contact his family member to ensure safe disposition.   # Polysubstance use disorder (alcohol, marijuana and methamphetamine) Patient denies any withdrawal symptoms. Although he will benefit from continuing gabapentin for abstinence from alcohol, he declines this option. Will discontinue this medication. He is motivated for sobriety; will liaise with SW for outpatient follow up.   Plan - Discontinue gabapentin - Decrease olanzapine 2.5 mg qhs - - Admit for crisis management and stabilization. - Medication management to reduce current symptoms to base line and improve the patient's overall level of functioning. - Monitor for the adverse effect of the medications and anger outbursts - Continue 15 minutes observation for safety concerns - Encouraged  to participate in milieu therapy and group therapy counseling sessions and also work with coping skills -  Develop treatment plan to decrease risk of relapse upon discharge and to reduce the need for readmission. -  Psycho-social education regarding relapse prevention and self care. - Health care follow up as needed for medical problems. - Restart home medications where appropriate.  Treatment Plan Summary: Daily contact with patient to assess and evaluate symptoms and progress in treatment  Observation Level/Precautions:  15 minute checks  Laboratory:  as needed  Psychotherapy:  Group therapy  Medications:  As above  Consultations:  SW  Discharge Concerns: SI/HI    Estimated LOS: 5-7 days  Other:     Physician Treatment Plan for Primary Diagnosis: Substance-induced psychotic disorder with delusions (HCC) Long Term Goal(s): Improvement in symptoms so as ready for discharge  Short Term Goals: Ability to demonstrate self-control will improve and Ability to identify and develop effective coping behaviors will improve  Physician Treatment Plan for Secondary Diagnosis: Principal Problem:   Substance-induced psychotic disorder with delusions (HCC)  Long Term Goal(s): Improvement in symptoms so as ready for discharge  Short Term Goals: Ability to demonstrate self-control will improve and Ability to identify   and develop effective coping behaviors will improve  I certify that inpatient services furnished can reasonably be expected to improve the patient's condition.    Norman Clay, MD 12/20/201712:16 PM

## 2016-09-08 NOTE — BHH Counselor (Signed)
Adult Comprehensive Assessment  Patient ID: Logan Branch, male   DOB: 29-Mar-1986, 30 y.o.   MRN: [id number]  Information Source: Information source: Patient  Current Stressors:  Educational / Learning stressors: Denies Employment / Job issues:  Works Systems analystoperating machines for 2 years Family Relationships: Patient's meth use has put strain on family Sport and exercise psychologistrelationships Financial / Lack of resources (include bankruptcy): Denies Housing / Lack of housing: Lives in [place] with wife, 2 children, and in-laws Physical health (include injuries & life threatening diseases): Denies Social relationships: Denies Substance abuse: Using meth regularly for 2-3 months; THC use Bereavement / Loss: Denies  Living/Environment/Situation:  Living Arrangements: Spouse/significant other, Children, Parent Living conditions (as described by patient or guardian): Lives in [place] with wife, 2 children, and in-laws How long has patient lived in current situation?: unknown What is atmosphere in current home: Comfortable  Family History:  Marital status: Married Number of Years Married: 5 What types of issues is patient dealing with in the relationship?: Patient's meth use has put strain on family relationships Does patient have children?: Yes How many children?: 2 How is patient's relationship with their children?: good relationship with 4 & 2 y.o. children. Wife is pregnant with third child  Childhood History:  By whom was/is the patient raised?: Both parents Description of patient's relationship with caregiver when they were a child: Chaotic childhood. Lived in [place] until age of 89, then lived in a refugee camp in [place] until an adult. Reports a good relationship with parents Patient's description of current relationship with people who raised him/her: Good relationship with parents who live in [place]. Particularly close with mother, does not want to make her worry Does patient have siblings?:  Yes Number of Siblings: 2 Description of patient's current relationship with siblings: Distant but friendly relationship with brother and sister who live in [place] Did patient suffer any verbal/emotional/physical/sexual abuse as a child?: No Did patient suffer from severe childhood neglect?: No Has patient ever been sexually abused/assaulted/raped as an adolescent or adult?: No Was the patient ever a victim of a crime or a disaster?: Yes Patient description of being a victim of a crime or disaster: Grew up in refugee camp in [place] Witnessed domestic violence?: No Has patient been effected by domestic violence as an adult?: No  Education:  Highest grade of school patient has completed: Completed school in [place] Currently a Consulting civil engineerstudent?: No Learning disability?: No  Employment/Work Situation:   Employment situation: Employed Where is patient currently employed?:  Works Systems analystoperating machines for 2 years Patient's job has been impacted by current illness: No What is the longest time patient has a held a job?: unknown Has patient ever been in the [employer] and Companymilitary?: No  Financial Resources:   Surveyor, quantityinancial resources: Income from employment Does patient have a representative payee or guardian?: No  Alcohol/Substance Abuse:   What has been your use of drugs/alcohol within the last 12 months?: Using meth regularly for 2-3 months; THC use If attempted suicide, did drugs/alcohol play a role in this?: No Alcohol/Substance Abuse Treatment Hx: Denies past history Has alcohol/substance abuse ever caused legal problems?: No  Social Support System:   Conservation officer, natureatient's Community Support System: Fair Development worker, communityDescribe Community Support System: Family support Type of faith/religion: Christian How does patient's faith help to cope with current illness?: prays  Leisure/Recreation:   Leisure and Hobbies: playing the guitar and drums  Strengths/Needs:   What things does the patient do well?: Cares about his family In what  areas does patient struggle / problems  for patient: substance abuse  Discharge Plan:   Does patient have access to transportation?: Yes (taxi or bus) Will patient be returning to same living situation after discharge?: Yes Currently receiving community mental health services: No If no, would patient like referral for services when discharged?: No Does patient have financial barriers related to discharge medications?: Yes Patient description of barriers related to discharge medications: no insurance, limited income  Summary/Recommendations:     Patient is a 30 year old male who presented to the hospital with substance induced psychotic disorder with delusions. Primary triggers for admission include substance abuse. Patient will benefit from crisis stabilization, medication evaluation, group therapy and psycho education in addition to case management for discharge planning. At discharge, it is recommended that Pt remain compliant with established discharge plan and continued treatment.   Logan Branch. 09/08/2016

## 2016-09-08 NOTE — Progress Notes (Signed)
D: Pt received in bed. Easily aroused and responsive. Refused am Gabapentin. Stated he does not need medication. Pt's demeanor is polite and otherwise cooperative. Denied SI/HI/AVH/Pain  A: Safety checks maintained. Monitored for symptoms of withdrawal from methamphetamine.   R: Pt contracted for safety. No additional concerns voiced.
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
## 2016-09-08 NOTE — Tx Team (Signed)
Interdisciplinary Treatment and Diagnostic Plan Update  09/08/2016 Time of Session: 9:30am Honor JunesKyaw Musa [place] MRN: [id number]  Principal Diagnosis: Substance-induced psychotic disorder with delusions (HCC)   Current Medications:  Current Facility-Administered Medications  Medication Dose Route Frequency Provider Last Rate Last Dose  . acetaminophen (TYLENOL) tablet 650 mg  650 mg Oral Q6H PRN Charm RingsJamison Y Lord, NP      . alum & mag hydroxide-simeth (MAALOX/MYLANTA) 200-200-20 MG/5ML suspension 30 mL  30 mL Oral PRN Charm RingsJamison Y Lord, NP      . ibuprofen (ADVIL,MOTRIN) tablet 600 mg  600 mg Oral Q8H PRN Charm RingsJamison Y Lord, NP      . magnesium hydroxide (MILK OF MAGNESIA) suspension 30 mL  30 mL Oral Daily PRN Charm RingsJamison Y Lord, NP      . OLANZapine (ZYPREXA) tablet 2.5 mg  2.5 mg Oral QHS Neysa Hottereina Hisada, MD      . ondansetron (ZOFRAN) tablet 4 mg  4 mg Oral Q8H PRN Charm RingsJamison Y Lord, NP       PTA Medications: No prescriptions prior to admission.    Patient Stressors: Substance abuse  Patient Strengths: Ability for insight Average or above average intelligence Physical Health Supportive family/friends  Treatment Modalities: Medication Management, Group therapy, Case management,  1 to 1 session with clinician, Psychoeducation, Recreational therapy.   Physician Treatment Plan for Primary Diagnosis: Substance-induced psychotic disorder with delusions (HCC) Long Term Goal(s): Improvement in symptoms so as ready for discharge Improvement in symptoms so as ready for discharge   Short Term Goals: Ability to demonstrate self-control will improve Ability to identify and develop effective coping behaviors will improve Ability to demonstrate self-control will improve Ability to identify and develop effective coping behaviors will improve  Medication Management: Evaluate patient's response, side effects, and tolerance of medication regimen.  Therapeutic Interventions: 1 to 1 sessions, Unit Group sessions  and Medication administration.  Evaluation of Outcomes: Progressing   RN Treatment Plan for Primary Diagnosis: Substance-induced psychotic disorder with delusions (HCC) Long Term Goal(s): Knowledge of disease and therapeutic regimen to maintain health will improve  Short Term Goals: Ability to remain free from injury will improve, Ability to disclose and discuss suicidal ideas, Ability to identify and develop effective coping behaviors will improve and Compliance with prescribed medications will improve  Medication Management: RN will administer medications as ordered by provider, will assess and evaluate patient's response and provide education to patient for prescribed medication. RN will report any adverse and/or side effects to prescribing provider.  Therapeutic Interventions: 1 on 1 counseling sessions, Psychoeducation, Medication administration, Evaluate responses to treatment, Monitor vital signs and CBGs as ordered, Perform/monitor CIWA, COWS, AIMS and Fall Risk screenings as ordered, Perform wound care treatments as ordered.  Evaluation of Outcomes: Progressing   LCSW Treatment Plan for Primary Diagnosis: Substance-induced psychotic disorder with delusions (HCC) Long Term Goal(s): Safe transition to appropriate next level of care at discharge, Engage patient in therapeutic group addressing interpersonal concerns.  Short Term Goals: Engage patient in aftercare planning with referrals and resources, Increase social support, Increase emotional regulation, Facilitate patient progression through stages of change regarding substance use diagnoses and concerns, Identify triggers associated with mental health/substance abuse issues and Increase skills for wellness and recovery  Therapeutic Interventions: Assess for all discharge needs, 1 to 1 time with Social worker, Explore available resources and support systems, Assess for adequacy in community support network, Educate family and  significant other(s) on suicide prevention, Complete Psychosocial Assessment, Interpersonal group therapy.  Evaluation of Outcomes: Progressing  Progress in Treatment :  Attending groups: Continuing to assess  Participating in groups: Continuing to assess  Taking medication as prescribed: Yes, MD continuing to assess for appropriate medication regimen  Toleration medication: Yes  Family/Significant other contact made: Treatment team assessing for appropriate contacts  Patient understands diagnosis: Yes  Discussing patient identified problems/goals with staff: Yes  Medical problems stabilized or resolved: Yes  Denies suicidal/homicidal ideation: Yes, denies  Issues/concerns per patient self-inventory: None reported  Other: N/A  New problem(s) identified: None reported at this time    New Short Term/Long Term Goal(s): None at this time    Discharge Plan or Barriers: Patient plans to return home to follow up with outpatient services.    Reason for Continuation of Hospitalization: Anxiety Depression Medication stabilization Suicidal Ideations Withdrawal symptoms  Estimated Length of Stay: 1-2 days    Attendees: Patient: 09/08/2016    Physician: Dr. Jama Flavorsobos, Dr. Vanetta ShawlHisada MD 09/08/2016    Nursing: Elby Beckoni Waller, Ninetta Lightsheresa, Dan McCool, Jane Ochieng, RN 09/08/2016    RN Care Manager: Onnie BoerJennifer Clark, RN 09/08/2016    Social Worker: Vernie ShanksLauren Newton, LCSW; Belenda CruiseKristin Marliss Buttacavoli,LCSW; DerbyHeather Smart, LCSW 09/08/2016    Recreational Therapist:  09/08/2016    Other: Armandina StammerAgnes Nwoko, NP; Claudette Headonrad Withrow, NP 09/08/2016      Samuella BruinKristin Marelin Tat, LCSW Clinical Social Worker [facility] [phone]

## 2016-09-08 NOTE — [facility] Group Notes (Signed)
[facility] LCSW Group Therapy 09/08/2016  1:15 PM Type of Therapy: Group Therapy Participation Level: Minimal  Participation Quality: Attentive, Sharing  Affect: Appropriate  Cognitive: Alert and Oriented  Insight: Developing/Improving and Engaged  Engagement in Therapy: Developing/Improving and Engaged  Modes of Intervention: Clarification, Confrontation, Discussion, Education, Exploration, Limit-setting, Orientation, Problem-solving, Rapport Building, Dance movement psychotherapisteality Testing, Socialization and Support  Summary of Progress/Problems: The topic for group today was emotional regulation. This group focused on both positive and negative emotion identification and allowed group members to process ways to identify feelings, regulate negative emotions, and find healthy ways to manage internal/external emotions. Group members were asked to reflect on a time when their reaction to an emotion led to a negative outcome and explored how alternative responses using emotion regulation would have benefited them. Group members were also asked to discuss a time when emotion regulation was utilized when a negative emotion was experienced. Patient participated minimally in discussion but stated that he enjoys playing instruments including the drums when he feels like he is losing control.  Samuella BruinKristin Izear Pine, MSW, LCSW Clinical Social Worker [facility] [phone]

## 2016-09-09 NOTE — [facility] Group Notes (Signed)
The focus of this group is to educate the patient on the purpose and policies of crisis stabilization and provide a format to answer questions about their admission.  The group details unit policies and expectations of patients while admitted.  Patient did not attend 0900 nurse education orientation group morning.  Patient stayed in bed.   

## 2016-09-09 NOTE — Plan of Care (Signed)
Problem: Education: Goal: Emotional status will improve Outcome: Progressing Nurse discussed depression/coping skills with patient.    

## 2016-09-09 NOTE — [facility] Group Notes (Signed)
[facility] LCSW Group Therapy 09/09/2016  10AM   Type of Therapy: Group Therapy  Participation Level: Did Not Attend. Patient invited to participate but declined.   Samuella BruinKristin Ariatna Jester, MSW, LCSW Clinical Social Worker [facility] [phone]

## 2016-09-09 NOTE — Progress Notes (Signed)
CSW spoke briefly with wife Ku Cardell PeachGay [phone]. Discussion was difficult due to language barrier, wife speaks broken [place]. She states that she has spoken with patient today and is going to talk to her family about patient returning home. She states that she will speak with patient after talking to her family. CSW encouraged wife to bring patient back to the hospital if she notices worsening symptoms after discharge.   Samuella BruinKristin Kia Varnadore, LCSW Clinical Social Worker [facility] [phone]

## 2016-09-09 NOTE — Progress Notes (Signed)
Patient ID: Logan Branch, male   DOB: [birth year], 30 y.o.   MRN: [id number] D: Client visible on the unit, reports "everything well, better than the other day" "sleep well tonight, tomorrow ready to go home" A: Writer provided emotional support, encouraged client to report any concerns. Medications reviewed, administered as ordered. Staff will monitor q6215min for safety. R: Client is safe on the unit.

## 2016-09-09 NOTE — Plan of Care (Signed)
Problem: Education: Goal: Will be free of psychotic symptoms Outcome: Progressing Nurse discussed depression/coping skills with patient.    

## 2016-09-09 NOTE — Progress Notes (Signed)
[facility]  09/09/2016 1:42 PM Honor JunesKyaw Mykale Ambrocio  MRN:  [id number] Subjective:  "I feel much better today. I was hoping I could go home today."  Objective: Pt seen and chart reviewed. Pt is alert/oriented x4, calm, cooperative, and appropriate to situation. Pt denies suicidal/homicidal ideation and psychosis and does not appear to be responding to internal stimuli. Pt has poor insight into the severity of his symptoms and the scenario surrounding [facility].   Principal Problem: Substance-induced psychotic disorder with delusions (HCC) Diagnosis:   Patient Active Problem List   Diagnosis Date Noted  . Substance-induced psychotic disorder with delusions [facility](HCC) [F19.950] 09/07/2016    Priority: High  . Alcohol use disorder (HCC) [F10.99] 09/07/2016  . Polysubstance (excluding opioids) dependence (HCC) [F19.20] 09/07/2016   Total Time spent with patient: 15 minutes  Past Psychiatric History: see H&P  Past Medical History: History reviewed. No pertinent past medical history. History reviewed. No pertinent surgical history. Family History: History reviewed. No pertinent family history. Family Psychiatric  History: see H&P Social History:  History  Alcohol Use  . Yes     History  Drug Use  . Types: Marijuana, Methamphetamines    Social History   Social History  . Marital status: Married    Spouse name: N/A  . Number of children: N/A  . Years of education: N/A   Social History Main Topics  . Smoking status: Current Every Day Smoker    Packs/day: 0.50    Types: Cigarettes  . Smokeless tobacco: Never Used  . Alcohol use Yes  . Drug use:     Types: Marijuana, Methamphetamines  . Sexual activity: Not Asked   Other Topics Concern  . None   Social History Narrative  . None   Additional Social History:                         Sleep: Good  Appetite:  Good  Current Medications: Current Facility-Administered Medications  Medication Dose Route  Frequency Provider Last Rate Last Dose  . acetaminophen (TYLENOL) tablet 650 mg  650 mg Oral Q6H PRN Charm RingsJamison Y Lord, NP      . alum & mag hydroxide-simeth (MAALOX/MYLANTA) 200-200-20 MG/5ML suspension 30 mL  30 mL Oral PRN Charm RingsJamison Y Lord, NP      . ibuprofen (ADVIL,MOTRIN) tablet 600 mg  600 mg Oral Q8H PRN Charm RingsJamison Y Lord, NP      . magnesium hydroxide (MILK OF MAGNESIA) suspension 30 mL  30 mL Oral Daily PRN Charm RingsJamison Y Lord, NP      . nicotine polacrilex (NICORETTE) gum 2 mg  2 mg Oral PRN Kerry HoughSpencer E Simon, PA-C      . OLANZapine (ZYPREXA) tablet 2.5 mg  2.5 mg Oral QHS Neysa Hottereina Hisada, MD   2.5 mg at 09/08/16 2141  . ondansetron (ZOFRAN) tablet 4 mg  4 mg Oral Q8H PRN Charm RingsJamison Y Lord, NP        Lab Results: No results found for this or any previous visit (from the past 48 hour(s)).  Blood Alcohol level:  Lab Results  Component Value Date   ETH 21 (H) 09/06/2016    Metabolic Disorder Labs: No results found for: HGBA1C, MPG No results found for: PROLACTIN No results found for: CHOL, TRIG, HDL, CHOLHDL, VLDL, LDLCALC  Physical Findings: AIMS: Facial and Oral Movements Muscles of Facial Expression: None, normal Lips and Perioral Area: None, normal Jaw: None, normal Tongue: None, normal,Extremity  Movements Upper (arms, wrists, hands, fingers): None, normal Lower (legs, knees, ankles, toes): None, normal, Trunk Movements Neck, shoulders, hips: None, normal, Overall Severity Severity of abnormal movements (highest score from questions above): None, normal Incapacitation due to abnormal movements: None, normal Patient's awareness of abnormal movements (rate only patient's report): No Awareness, Dental Status Current problems with teeth and/or dentures?: No Does patient usually wear dentures?: No  CIWA:  CIWA-Ar Total: 0 COWS:     Musculoskeletal: Strength & Muscle Tone: within normal limits Gait & Station: normal Patient leans: N/A  Psychiatric Specialty Exam: Physical Exam  Review  of Systems  Psychiatric/Behavioral: Positive for depression and substance abuse. Negative for suicidal ideas. The patient is nervous/anxious and has insomnia.   All other systems reviewed and are negative.   Blood pressure 104/77, pulse 91, temperature 97.3 F (36.3 C), temperature source Oral, resp. rate 16, height 5\' 6"  (1.676 m), weight 55.8 kg (123 lb).Body mass index is 19.85 kg/m.  General Appearance: Casual and Fairly Groomed  Eye Contact:  Good  Speech:  Clear and Coherent and Normal Rate  Volume:  Normal  Mood:  Anxious  Affect:  Appropriate and Congruent  Thought Process:  Coherent, Goal Directed, Linear and Descriptions of Associations: Intact  Orientation:  Full (Time, Place, and Person)  Thought Content:  Symptoms, worries, discharge plans  Suicidal Thoughts:  No  Homicidal Thoughts:  No  Memory:  Immediate;   Fair Recent;   Fair Remote;   Fair  Judgement:  Fair  Insight:  Fair  Psychomotor Activity:  Normal  Concentration:  Concentration: Fair and Attention Span: Fair  Recall:  FiservFair  Fund of Knowledge:  Fair  Language:  Fair  Akathisia:  No  Handed:    AIMS (if indicated):     Assets:  Communication Skills Desire for Improvement Resilience Social Support  ADL's:  Intact  Cognition:  WNL  Sleep:  Number of Hours: 6.5   Treatment Plan Summary: Substance-induced psychotic disorder with delusions (HCC) unstable, and continues to warrant inpatient admission, managed as below:  Medications:  -continue Zyprexa 2.5mg  po qhs for psychosis   Beau FannyWithrow, John C, FNP 09/09/2016, 1:42 PM   Agree with NP Progress Note as above  Nehemiah MassedFernando Karsyn Jamie, MD

## 2016-09-09 NOTE — Progress Notes (Signed)
D:  Patient's self inventory sheet, patient sleeps good, sleep medication helpful.  Good appetite, normal energy level, good concentration.  Denied depression, hopeless and anxiety.  Denied withdrawals.  Denied SI.  Denied physical problems.  Denied pain.  Goal is to get home and be with family.  Will discuss going home with MD.  "Thank you to all staff and go home." A:  Medications administered per MD orders.  Emotional support and encouragement given patient. R:  Denied SI and HI, contracts for safety.  Denied A/V hallucinations.  Safety maintained with 15 minute checks.

## 2016-09-10 DIAGNOSIS — F1099 Alcohol use, unspecified with unspecified alcohol-induced disorder: Secondary | ICD-10-CM

## 2016-09-10 MED ORDER — HYDROXYZINE HCL 25 MG PO TABS: 25.0000 mg | ORAL_TABLET | Freq: Four times a day (QID) | ORAL | Status: DC | PRN

## 2016-09-10 MED ORDER — OLANZAPINE 5 MG PO TABS
5.0000 mg | ORAL_TABLET | Freq: Every day | ORAL | Status: DC
  Administered 2016-09-10 – 2016-09-12 (×3): 5 mg via ORAL
  Filled 2016-09-10: qty 2
  Filled 2016-09-10 (×4): qty 1
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
## 2016-09-10 NOTE — Progress Notes (Signed)
D:  Patient's self inventory sheet, patient sleeps good, sleep medication is helpful.  Good appetite, low energy level, good concentration.  Denied depression, hopeless and anxiety.  Denied withdrawals.  Denied SI.  Denied physical problems.  Denied pain.  Goal is to be discharged, be happy with family, and get ready for Christmas. A:  Medications administered per MD orders.  Emotional support and encouragement given patient. R:  Denied SI and HI, contracts for safety.  Denied A/V hallucinations.  Safety maintained with 15 minute checks.
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
## 2016-09-10 NOTE — Progress Notes (Signed)
Recreation Therapy Notes  Date: 09/10/16 Time: 0930 Location: 300 Hall Group Room  Group Topic: Stress Management  Goal Area(s) Addresses:  Patient will verbalize importance of using healthy stress management.  Patient will identify positive emotions associated with healthy stress management.   Intervention: Calm App  Activity :  Gratitude Meditation.  LRT introduced the stress management technique of meditation to group.  LRT played the meditation from the Calm app to allow patients to focus on things they should be grateful for and not the things they don't have.  Patients were to follow along as the meditation played to engage in the technique.  Education:  Stress Management, Discharge Planning.   Education Outcome: Acknowledges edcuation/In group clarification offered/Needs additional education  Clinical Observations/Feedback: Pt did not attend group.   Aliani Caccavale, LRT/CTRS         Kristofor Michalowski A 09/10/2016 11:39 AM 

## 2016-09-10 NOTE — [facility] Group Notes (Signed)
[facility] LCSW Group Therapy  09/10/2016 2:27 PM  Type of Therapy:  Group Therapy  Participation Level:  Active  Participation Quality:  Appropriate  Affect:  Appropriate  Cognitive:  Appropriate  Insight:  Developing/Improving  Engagement in Therapy:  Engaged  Modes of Intervention:  Discussion, Exploration and Problem-solving  Summary of Progress/Problems: Feelings around Relapse. Group members discussed the meaning of relapse and shared personal stories of relapse, how it affected them and others, and how they perceived themselves during this time. Group members were encouraged to identify triggers, warning signs and coping skills used when facing the possibility of relapse. Social supports were discussed and explored in detail. Post Acute Withdrawal Syndrome (handout provided) was introduced and examined. Pt's were encouraged to ask questions, talk about key points associated with PAWS, and process this information in terms of relapse prevention.  Discussed desire for "fresh start, I will show my family that I can be a better person, do better."  Unable to state clear plan for doing so.     Sallee Langenne C Cunningham 09/10/2016, 2:27 PM

## 2016-09-10 NOTE — Progress Notes (Signed)
Pt attended AA meeting this evening.  

## 2016-09-10 NOTE — Progress Notes (Signed)
Patient refused any medications, stated he did not need anxiety medication.

## 2016-09-10 NOTE — Progress Notes (Addendum)
[facility]  09/10/2016 4:50 PM Logan JunesKyaw Joshau Branch  MRN:  [id number] Subjective:  Patient states he is feeling "OK". He is focused on being discharged soon. Denies medication side effects .  Objective: Pt seen and case reviewed with treatment team. Patient is a 30 year old married male. Reports history of substance abuse, mainly cannabis, but states that over recent weeks to months he had been abusing " ice ", likely referring to methamphetamine. He was admitted under IVC due to suicidal and homicidal threats ( towards in-laws). Patient states that he had been hearing his family make statements about " damaging my body while I was asleep". Although denies any current hallucinations, and does not appear internally preoccupied, he reports he still believes they were trying to harm him.  He has limited insight regarding the likely negative impact that drug abuse has had on his cognition and mental health, but did express interest in potential for substance induced psychosis. States " I don't know, because I have used drugs a long time and this had never happened before". No disruptive or agitated behaviors on unit. Going to some groups. Of note, as per CSW note, wife has expressed reluctance for patient to return home after discharge, due to recent threats he made .      Principal Problem: Substance-induced psychotic disorder with delusions (HCC) Diagnosis:   Patient Active Problem List   Diagnosis Date Noted  . Alcohol use disorder (HCC) [F10.99] 09/07/2016  . Polysubstance (excluding opioids) dependence (HCC) [F19.20] 09/07/2016  . Substance-induced psychotic disorder with delusions [facility](HCC) [F19.950] 09/07/2016   Total Time spent with patient: 20 minutes   Past Psychiatric History: see H&P  Past Medical History: History reviewed. No pertinent past medical history. History reviewed. No pertinent surgical history. Family History: History reviewed. No pertinent family history. Family  Psychiatric  History: see H&P Social History:  History  Alcohol Use  . Yes     History  Drug Use  . Types: Marijuana, Methamphetamines    Social History   Social History  . Marital status: Married    Spouse name: N/A  . Number of children: N/A  . Years of education: N/A   Social History Main Topics  . Smoking status: Current Every Day Smoker    Packs/day: 0.50    Types: Cigarettes  . Smokeless tobacco: Never Used  . Alcohol use Yes  . Drug use:     Types: Marijuana, Methamphetamines  . Sexual activity: Not Asked   Other Topics Concern  . None   Social History Narrative  . None   Additional Social History:   Sleep: Good  Appetite:  Good  Current Medications: Current Facility-Administered Medications  Medication Dose Route Frequency Provider Last Rate Last Dose  . acetaminophen (TYLENOL) tablet 650 mg  650 mg Oral Q6H PRN Charm RingsJamison Y Lord, NP      . alum & mag hydroxide-simeth (MAALOX/MYLANTA) 200-200-20 MG/5ML suspension 30 mL  30 mL Oral PRN Charm RingsJamison Y Lord, NP      . ibuprofen (ADVIL,MOTRIN) tablet 600 mg  600 mg Oral Q8H PRN Charm RingsJamison Y Lord, NP      . magnesium hydroxide (MILK OF MAGNESIA) suspension 30 mL  30 mL Oral Daily PRN Charm RingsJamison Y Lord, NP      . nicotine polacrilex (NICORETTE) gum 2 mg  2 mg Oral PRN Kerry HoughSpencer E Simon, PA-C   2 mg at 09/09/16 2222  . OLANZapine (ZYPREXA) tablet 2.5 mg  2.5 mg Oral QHS Barbee Cougheina  Hisada, MD   2.5 mg at 09/09/16 2130  . ondansetron (ZOFRAN) tablet 4 mg  4 mg Oral Q8H PRN Charm RingsJamison Y Lord, NP        Lab Results: No results found for this or any previous visit (from the past 48 hour(s)).  Blood Alcohol level:  Lab Results  Component Value Date   ETH 21 (H) 09/06/2016    Metabolic Disorder Labs: No results found for: HGBA1C, MPG No results found for: PROLACTIN No results found for: CHOL, TRIG, HDL, CHOLHDL, VLDL, LDLCALC  Physical Findings: AIMS: Facial and Oral Movements Muscles of Facial Expression: None, normal Lips and  Perioral Area: None, normal Jaw: None, normal Tongue: None, normal,Extremity Movements Upper (arms, wrists, hands, fingers): None, normal Lower (legs, knees, ankles, toes): None, normal, Trunk Movements Neck, shoulders, hips: None, normal, Overall Severity Severity of abnormal movements (highest score from questions above): None, normal Incapacitation due to abnormal movements: None, normal Patient's awareness of abnormal movements (rate only patient's report): No Awareness, Dental Status Current problems with teeth and/or dentures?: No Does patient usually wear dentures?: No  CIWA:  CIWA-Ar Total: 1 COWS:  COWS Total Score: 2  Musculoskeletal: Strength & Muscle Tone: within normal limits Gait & Station: normal Patient leans: N/A  Psychiatric Specialty Exam: Physical Exam  Review of Systems  Psychiatric/Behavioral: Positive for depression and substance abuse. Negative for suicidal ideas. The patient is nervous/anxious and has insomnia.   All other systems reviewed and are negative.   Blood pressure 114/72, pulse 92, temperature 98.4 F (36.9 C), temperature source Oral, resp. rate 16, height 5\' 6"  (1.676 m), weight 55.8 kg (123 lb).Body mass index is 19.85 kg/m.  General Appearance: Fairly Groomed  Eye Contact:  Good  Speech:  Normal Rate  Volume:  Normal  Mood:  Denies depression, states mood is "OK"  Affect:  Appropriate, mildly anxious   Thought Process: linear   Orientation:  Full (Time, Place, and Person)  Thought Content:  Denies hallucinations,  not internally preoccupied at this time, but states ongoing belief that his family had been wanting to hurt him prior to his admission   Suicidal Thoughts:  No denies any suicidal ideations or self injurious ideations  Homicidal Thoughts:  No denies any homicidal or violent ideations- denies any plan or intention of violence towards family members, states " they are my family, I do not want to hurt them"  Memory:   Recent and  remote grossly intact   Judgement:  Fair  Insight:  Fair  Psychomotor Activity:  Normal  Concentration:  Concentration: Good and Attention Span: Good  Recall:  Good  Fund of Knowledge:  Good  Language:  Good  Akathisia:  No  Handed:    AIMS (if indicated):     Assets:  Communication Skills Desire for Improvement Resilience Social Support  ADL's:  Intact  Cognition:  WNL  Sleep:  Number of Hours: 6    Assessment - patient presents calm, no disruptive or agitated behaviors on unit. He is focused on being discharged soon. He denies hallucinations,and does not appear internally preoccupied, but continues to express belief that his family was plotting to hurt him physically. He reports recent use of cannabis and of methamphetamine ( although of note, admission UDS is negative ) , but has limited insight into potential contributory role of drugs on paranoid ideations/psychotic symptoms. Of note, wife has stated that she does not want patient to return home after discharge.  Treatment Plan Summary: Substance-induced psychotic  disorder with delusions (HCC) Continue to encourage group and milieu participation to work on coping skills and symptom reduction  Continue to encourage abstinence/ recovery  from drugs and alcohol as part of treatment team Treatment team working on disposition planning   Medications:  -Increase Zyprexa to 5 mgrs  po qhs for psychosis -Vistaril 25 mgrs Q 6 hours PRN for anxiety  Monitor HgbA1C, Lipid Panel, Prolactin serum level    Nehemiah Massed, MD 09/10/2016, 4:50 PM   Patient ID: Logan Branch, male   DOB: [birth year], 30 y.o.   MRN: [id number]

## 2016-09-10 NOTE — Plan of Care (Signed)
Problem: Education: Goal: Knowledge of the prescribed therapeutic regimen will improve Outcome: Progressing Nurse discussed depression/anxiety/coping skills with patient.    

## 2016-09-10 NOTE — Progress Notes (Signed)
Patient ID: Logan Branch, male   DOB: 1986-04-03, 30 y.o.   MRN: [id number] D: Client seen in the dayroom playing cards, smiling interacts appropriately. Client is animated today. "I need something for pain, muscle pain, workout pain"  Client rates pain "3" of 10. A: Medications reviewed and administered as ordered. Acetaminophen 650 mg given for pain.(see MAR). Staff will monitor q4315min for safety.

## 2016-09-11 DIAGNOSIS — F17210 Nicotine dependence, cigarettes, uncomplicated: Secondary | ICD-10-CM

## 2016-09-11 DIAGNOSIS — F0630 Mood disorder due to known physiological condition, unspecified: Secondary | ICD-10-CM

## 2016-09-11 DIAGNOSIS — Z79899 Other long term (current) drug therapy: Secondary | ICD-10-CM

## 2016-09-11 DIAGNOSIS — F19950 Other psychoactive substance use, unspecified with psychoactive substance-induced psychotic disorder with delusions: Principal | ICD-10-CM

## 2016-09-11 MED ORDER — VITAMIN B-1 100 MG PO TABS
100.0000 mg | ORAL_TABLET | Freq: Every day | ORAL | Status: DC
  Administered 2016-09-12 – 2016-09-14 (×3): 100 mg via ORAL
  Filled 2016-09-11 (×5): qty 1

## 2016-09-11 MED ORDER — HYDROXYZINE HCL 25 MG PO TABS: 25.0000 mg | ORAL_TABLET | Freq: Four times a day (QID) | ORAL | 0 refills | Status: DC | PRN

## 2016-09-11 MED ORDER — LORAZEPAM 1 MG PO TABS
1.0000 mg | ORAL_TABLET | Freq: Four times a day (QID) | ORAL | Status: AC
  Administered 2016-09-11: 1 mg via ORAL
  Filled 2016-09-11: qty 1

## 2016-09-11 MED ORDER — OLANZAPINE 5 MG PO TABS: 5.0000 mg | ORAL_TABLET | Freq: Every day | ORAL | 0 refills | Status: DC

## 2016-09-11 MED ORDER — LORAZEPAM 1 MG PO TABS
1.0000 mg | ORAL_TABLET | Freq: Four times a day (QID) | ORAL | Status: AC | PRN
  Administered 2016-09-13: 1 mg via ORAL
  Filled 2016-09-11 (×2): qty 1

## 2016-09-11 MED ORDER — ADULT MULTIVITAMIN W/MINERALS CH
1.0000 | ORAL_TABLET | Freq: Every day | ORAL | Status: DC
  Administered 2016-09-12 – 2016-09-14 (×3): 1 via ORAL
  Filled 2016-09-11 (×6): qty 1

## 2016-09-11 MED ORDER — LORAZEPAM 1 MG PO TABS
1.0000 mg | ORAL_TABLET | Freq: Three times a day (TID) | ORAL | Status: AC
  Administered 2016-09-12 (×3): 1 mg via ORAL
  Filled 2016-09-11 (×3): qty 1

## 2016-09-11 MED ORDER — LORAZEPAM 1 MG PO TABS
1.0000 mg | ORAL_TABLET | Freq: Two times a day (BID) | ORAL | Status: AC
  Administered 2016-09-13 (×2): 1 mg via ORAL
  Filled 2016-09-11 (×2): qty 1

## 2016-09-11 MED ORDER — HYDROXYZINE HCL 25 MG PO TABS: 25.0000 mg | ORAL_TABLET | Freq: Four times a day (QID) | ORAL | Status: DC | PRN

## 2016-09-11 MED ORDER — LORAZEPAM 1 MG PO TABS
1.0000 mg | ORAL_TABLET | Freq: Every day | ORAL | Status: AC
  Administered 2016-09-14: 1 mg via ORAL
  Filled 2016-09-11: qty 1

## 2016-09-11 MED ORDER — ONDANSETRON 4 MG PO TBDP: 4.0000 mg | ORAL_TABLET | Freq: Four times a day (QID) | ORAL | Status: AC | PRN

## 2016-09-11 MED ORDER — THIAMINE HCL 100 MG/ML IJ SOLN: 100.0000 mg | Freq: Once | INTRAMUSCULAR | Status: DC

## 2016-09-11 MED ORDER — NICOTINE POLACRILEX 2 MG MT GUM: 2.0000 mg | CHEWING_GUM | OROMUCOSAL | 0 refills | Status: DC | PRN

## 2016-09-11 MED ORDER — LOPERAMIDE HCL 2 MG PO CAPS: 2.0000 mg | ORAL_CAPSULE | ORAL | Status: AC | PRN

## 2016-09-11 NOTE — B[facility] Group Notes (Signed)
Nursing Psycho-educational Group:   Patient attended and actively participated in nursing group. Cooperative and engaged. 

## 2016-09-11 NOTE — [facility] Suicide Risk Assessment (Addendum)
[facility]   Principal Problem: Substance-induced psychotic disorder with delusions [facility](HCC) Discharge Diagnoses:  Patient Active Problem List   Diagnosis Date Noted  . Alcohol use disorder (HCC) [F10.99] 09/07/2016  . Polysubstance (excluding opioids) dependence (HCC) [F19.20] 09/07/2016  . Substance-induced psychotic disorder with delusions [facility](HCC) [F19.950] 09/07/2016    Total Time spent with patient: 30 minutes  Musculoskeletal: Strength & Muscle Tone: within normal limits Gait & Station: normal Patient leans: no lean  Psychiatric Specialty Exam: Review of Systems  Gastrointestinal: Negative for nausea.  Musculoskeletal: Negative for myalgias.  Neurological: Negative for tremors.  Psychiatric/Behavioral: Negative for depression and suicidal ideas.    Blood pressure 105/80, pulse (!) 118, temperature 98.3 F (36.8 C), resp. rate 17, height 5\' 6"  (1.676 m), weight 55.8 kg (123 lb).Body mass index is 19.85 kg/m.  General Appearance: Casual  Eye Contact::  Fair  Speech:  Normal Rate409  Volume:  Decreased  Mood:  Euthymic  Affect:  Constricted  Thought Process:  Goal Directed  Orientation:  Full (Time, Place, and Person)  Thought Content:  Logical  Suicidal Thoughts:  No  Homicidal Thoughts:  No  Memory:  Immediate;   Fair Recent;   Fair  Judgement:  Fair  Insight:  Fair  Psychomotor Activity:  Normal  Concentration:  Fair  Recall:  [employer]Fair  Fund of Knowledge:Fair  Language: Fair  Akathisia:  Negative  Handed:  Right  AIMS (if indicated):     Assets:  Desire for Improvement  Sleep:  Number of Hours: 6  Cognition: WNL  ADL's:  Intact   Mental Status Per Nursing Assessment::   On Admission:  NA  Demographic Factors:  Male and Low socioeconomic status  Loss Factors: Financial problems/change in socioeconomic status  Historical Factors: drug use  Risk Reduction Factors:   Sense of responsibility to family and Living with another  person, especially a relative  Continued Clinical Symptoms:  Alcohol/Substance Abuse/Dependencies Unstable or Poor Therapeutic Relationship  Cognitive Features That Contribute To Risk:  None    Suicide Risk:  Minimal: No identifiable suicidal ideation.  Patients presenting with no risk factors but with morbid ruminations; may be classified as minimal risk based on the severity of the depressive symptoms  Follow-up Information    Patient declines referral for outpatient services. Follow up.          He continues to remain remorseful and says it was drug and meth related that he acted that out during admission.  He wants to be with family or friends and continues to deny any thoughts of harming family, kids or anyone.  Says he was put down before that he uses drugs and that made him more upset. He understands drugs can influence his behaviour and judjement. He wants to remain sober Referrals and support groups iwere informed of possible discharge . As of now his pulse is high and related to concern put forward from family members will hold him today. Start detox protocol with ativan considering he was using marijuana, alcohol and meth.  Re eval mood symptoms tomorrow.    Plan Of Care/Follow-up recommendations:  Activity:  as tolerated Diet:  regular  Thresa RossAKHTAR, Darien Kading, MD 09/11/2016, 11:28 AM

## 2016-09-11 NOTE — Social Work (Signed)
CSW spoke with pt's wife, Wynn MaudlinKu Gay [phone]) through a Clydie BraunKaren interpreter (ID # [phone]), per Dr. Baxter FlatteryAhktar's request.  CSW first spoke with wife without an interpreter but wife appeared confused and unable to fully answer questions; CSW called back with an interpreter.  Wife states that she has been talking to her husband frequently since he's been in the hospital.  Wife states that she is concerned about pt's past behaviors and that he could hurt her family again, as he seems to blame them and target her parents for what happened.  Wife states that she is not concerned about her or their children's well being, only her parents, who live with them.  Wife states that pt says he can stay somewhere else at discharge if needed, to show his family he is better and won't hurt them.  Wife overall feels pt is safe to discharge today.   CSW spoke with pt, who expresses remorse and regret for his past behaviors and actions to his family.  Pt states that he will not hurt them and will stay with a friend at discharge until his family sees he's well and won't hurt them.    Jeanelle MallingChelsea Lovenia Debruler, [place] 09/11/2016  10:37 AM

## 2016-09-11 NOTE — BHH Counselor (Signed)
Clinical Social Work Note  Numerous interactions with patient today, as he insists upon discharge by tomorrow.  Per his discussion with another CSW earlier in the day, he was asked by this CSW and his nurse for written consent to talk with a friend who can vouch that he can safely stay with the friend if not allowed back where his wife and her parents are currently staying.  He refused repeatedly to sign consent, stating he is not a "kid" but an adult and can go to a motel or anywhere else he wants to go.  It was explained how important it is for the hospital to ensure he has a safety discharge plan, but he would not give consent.  Later, he again spoke with CSW and said he is trying to get in touch with a friend about staying with him, but as of yet there has been no answer.  He told what happened when he kept telling his father-in-law to "come out and talk to me" and continues to not understand why his family felt threatened by him.  He decline follow-up and said he does not need to go to a counselor or 12-step groups to stay off drugs/alcohol.  He stated he has "made up my mind" and will stay away from them on his own.  When asked if he has been put on medication that would need follow-up, he initially said he will take the medicine when he leaves the hospital.  But when told his script would only last 30 days, he was upset once more and asked "do I have a sickness that I need medicine?  What for?"  Pt also stated that he had 4 guns, but gave one pistol back to its owner, and gave one rifle to his father-in-law.  Therefore, he remains in possession of one rifle and one assault rifle that he states are locked in the trunk of his car.  He stated he uses these for hunting and will not have someone else secure them.    He remained insistent that he be discharged tomorrow.  CSW advised him to talk with his wife tonight so she can tell how he is doing, then CSW will call her again tomorrow.  Logan Branch  Grossman-Orr, LCSW 09/11/2016, 5:15 PM

## 2016-09-11 NOTE — Progress Notes (Signed)
[facility] Group Notes:  (Nursing/MHT/Case Management/Adjunct)  Date:  09/11/2016  Time:  2030  Type of Therapy:  wrap up group  Participation Level:  Active  Participation Quality:  Appropriate, Attentive, Sharing and Supportive  Affect:  Appropriate  Cognitive:  Alert  Insight:  Improving  Engagement in Group:  Engaged  Modes of Intervention:  Clarification, Education and Support  Summary of Progress/Problems: Pt shared that he was unhappy that his discharge was cancelled due to his family wanting him to remain here longer. Pt shared that he has been arguing a lot with his family and not sure if he can go back home. Pt shared that he would go back if they would have him but if he needs to stay somewhere else he can but didn't say where. He just said he wanted to get his tool belt from the house. Pt focused on discharging soon,  Marcille BuffyMcNeil, Capitola Ladson S 09/11/2016, 10:01 PM

## 2016-09-11 NOTE — [facility] Group Notes (Signed)
[facility] LCSW Group Therapy Note  09/11/2016 at 10 to 11 AM  Type of Therapy and Topic:  Group Therapy: Avoiding Self-Sabotaging and Enabling Behaviors  Participation Level:  Did Not Attend despite overhead announcement and CSW knocking on patient's door asking him to attend.   Summary of Progress/Problems:  The main focus of today's process group was for the patient to identify ways in which they have in the past sabotaged their own recovery or even sabotaged the holidays for themselves and perhaps others.   Amaura Authier C Antonio Woodhams, LCSW   

## 2016-09-11 NOTE — Progress Notes (Signed)
D: Pt had a very eventful day. Very agitated with regards to family issues and wanting to go home. Pt's pastor called to say that his wife and in-laws were afraid because pt apparently pt called home today and threatened his in-laws angry because they had him IVC'D. Pt became very upset when he was told that he would not be going home. He hit the wall in the hallway and was heard on the phone in a foreign language speaking in a loud pressed voice. He denied SI/HI/AVH/Pain.    A:  Reported events to MD Aktar and SW Clerance LavMarianne. Encouraged to utilize coping skills learned with regards to anger issues. Safety checks maintained.    R: Pt denied threatening his wife or in-laws. He did participate in groups and later apologized for hitting the wall and becoming angry. He verbalized understanding that he does need to control his anger and to not use substances once released. He contracted for safety.
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
## 2016-09-12 LAB — LIPID PANEL
CHOLESTEROL: 163 mg/dL (ref 0–200)
HDL: 80 mg/dL (ref >40)
LDL Cholesterol: 65 mg/dL (ref 0–99)
Total CHOL/HDL Ratio: 2 RATIO
Triglycerides: 92 mg/dL (ref <150)
VLDL: 18 mg/dL (ref 0–40)

## 2016-09-12 NOTE — Progress Notes (Signed)
Pt spent most of the evening at the nurse's station trying to convince staff that he was well enough to be discharged.  He is frustrated that he is still here.  He says that he has been told many times that he will be discharged, then it is cancelled.  Pt says he is tired of being here and wants to see his family.  He says that family came to get his car keys to get his guns out of the trunk and secure them.  He denies SI/HI/AVH.  He says that his family won't answer when he tries to call them.  He said that one time his young daughter answered the phone and said, "daddy, when are you coming home", and then the phone went dead.  He told writer this with tears in his eyes.  He told this Clinical research associatewriter that he would think about going to a drug rehab.  Support and encouragement offered.  Discharge plans are in process.  Pt has been cooperative and appropriate this evening.  Safety maintained with q15 minute checks.

## 2016-09-12 NOTE — [facility] Group Notes (Signed)
Nurse Psycho-Educational Group centered on human needs and healthy support systems.  Patient attended the group and engaged actively.  
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
## 2016-09-12 NOTE — Progress Notes (Signed)
[facility]  09/12/2016 10:51 AM Logan Branch Cleophus Dougher  MRN:  [id number] Subjective:  Patient states he is feeling "OK". Remains focused on discharge. But much calmer today since he was given ativan and has taken zyprexa. Denies medication side effects .  Objective: Pt seen and case reviewed with treatment team. Patient is a 30 year old married male. Reports history of substance abuse, mainly cannabis, but states that over recent weeks to months he had been abusing " ice ", likely referring to methamphetamine. He was admitted under IVC due to suicidal and threats to family.  He is much calmer , denies voices . Remorseful Wants to be discharged but since he only started meds yesterday I see improvement. Much calmer  Says has no intent to harm and wants to be with family in christmas Family has called yesterday and concern was safety and not comfortable with his discharge yesterday   Understands the negative impact of drugs and is showing better insight. . No disruptive or agitated behaviors on unit today. Going to some groups.       Principal Problem: Substance-induced psychotic disorder with delusions (HCC) Diagnosis:   Patient Active Problem List   Diagnosis Date Noted  . 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 Alcohol use disorder (HCC) [F10.99] 09/07/2016  . Polysubstance (excluding opioids) dependence (HCC) [F19.20] 09/07/2016  . Substance-induced psychotic disorder with delusions [facility](HCC) [F19.950] 09/07/2016   Total Time spent with patient: 20 minutes   Past Psychiatric History: see H&P  Past Medical History: History reviewed. No pertinent past medical history. History reviewed. No pertinent surgical history. Family History: History reviewed. No pertinent family history. Family Psychiatric  History: see H&P Social History:  History  Alcohol Use  . Yes     History  Drug Use  . Types: Marijuana, Methamphetamines    Social History   Social  History  . Marital status: Married    Spouse name: N/A  . Number of children: N/A  . Years of education: N/A   Social History Main Topics  . Smoking status: Current Every Day Smoker    Packs/day: 0.50    Types: Cigarettes  . Smokeless tobacco: Never Used  . Alcohol use Yes  . Drug use:     Types: Marijuana, Methamphetamines  . Sexual activity: Not Asked   Other Topics Concern  . None   Social History Narrative  . None   Additional Social History:   Sleep: Good  Appetite:  Good  Current Medications: Current Facility-Administered Medications  Medication Dose Route Frequency Provider Last Rate Last Dose  . acetaminophen (TYLENOL) tablet 650 mg  650 mg Oral Q6H PRN Charm RingsJamison Y Lord, NP   650 mg at 09/10/16 2141  . alum & mag hydroxide-simeth (MAALOX/MYLANTA) 200-200-20 MG/5ML suspension 30 mL  30 mL Oral PRN Charm RingsJamison Y Lord, NP      . hydrOXYzine (ATARAX/VISTARIL) tablet 25 mg  25 mg Oral Q6H PRN Craige CottaFernando A Cobos, MD      . ibuprofen (ADVIL,MOTRIN) tablet 600 mg  600 mg Oral Q8H PRN Charm RingsJamison Y Lord, NP      . loperamide (IMODIUM) capsule 2-4 mg  2-4 mg Oral PRN Thresa RossNadeem Legend Pecore, MD      . LORazepam (ATIVAN) tablet 1 mg  1 mg Oral Q6H PRN Thresa RossNadeem Betheny Suchecki, MD      . LORazepam (ATIVAN) tablet 1 mg  1 mg Oral TID Thresa RossNadeem Maliah Pyles, MD   1 mg at 09/12/16 [phone]  Followed by  . [START ON 09/13/2016] LORazepam (ATIVAN) tablet 1 mg  1 mg Oral BID Thresa RossNadeem Baldwin Racicot, MD       Followed by  . [START ON 09/14/2016] LORazepam (ATIVAN) tablet 1 mg  1 mg Oral Daily Thresa RossNadeem Caffie Sotto, MD      . magnesium hydroxide (MILK OF MAGNESIA) suspension 30 mL  30 mL Oral Daily PRN Charm RingsJamison Y Lord, NP      . multivitamin with minerals tablet 1 tablet  1 tablet Oral Daily Thresa RossNadeem Lewis Grivas, MD   1 tablet at 09/12/16 0916  . nicotine polacrilex (NICORETTE) gum 2 mg  2 mg Oral PRN Kerry HoughSpencer E Simon, PA-C   2 mg at 09/12/16 0919  . OLANZapine (ZYPREXA) tablet 5 mg  5 mg Oral QHS Craige CottaFernando A Cobos, MD   5 mg at 09/11/16 2213  .  ondansetron (ZOFRAN) tablet 4 mg  4 mg Oral Q8H PRN Charm RingsJamison Y Lord, NP      . ondansetron (ZOFRAN-ODT) disintegrating tablet 4 mg  4 mg Oral Q6H PRN Thresa RossNadeem Jamaica Inthavong, MD      . thiamine (B-1) injection 100 mg  100 mg Intramuscular Once Thresa RossNadeem Darryon Bastin, MD      . thiamine (VITAMIN B-1) tablet 100 mg  100 mg Oral Daily Thresa RossNadeem Trea Latner, MD   100 mg at 09/12/16 [id number]    Lab Results: No results found for this or any previous visit (from the past 48 hour(s)).  Blood Alcohol level:  Lab Results  Component Value Date   ETH 21 (H) 09/06/2016    Metabolic Disorder Labs: No results found for: HGBA1C, MPG No results found for: PROLACTIN No results found for: CHOL, TRIG, HDL, CHOLHDL, VLDL, LDLCALC  Physical Findings: AIMS: Facial and Oral Movements Muscles of Facial Expression: None, normal Lips and Perioral Area: None, normal Jaw: None, normal Tongue: None, normal,Extremity Movements Upper (arms, wrists, hands, fingers): None, normal Lower (legs, knees, ankles, toes): None, normal, Trunk Movements Neck, shoulders, hips: None, normal, Overall Severity Severity of abnormal movements (highest score from questions above): None, normal Incapacitation due to abnormal movements: None, normal Patient's awareness of abnormal movements (rate only patient's report): No Awareness, Dental Status Current problems with teeth and/or dentures?: No Does patient usually wear dentures?: No  CIWA:  CIWA-Ar Total: 0 COWS:  COWS Total Score: 2  Musculoskeletal: Strength & Muscle Tone: within normal limits Gait & Station: normal Patient leans: N/A  Psychiatric Specialty Exam: Physical Exam  HENT:  Head: Normocephalic.  Neurological: He is alert.  Skin: He is not diaphoretic.    Review of Systems  Cardiovascular: Negative for chest pain.  Gastrointestinal: Negative for nausea.  Psychiatric/Behavioral: Positive for depression and substance abuse. Negative for suicidal ideas. The patient is nervous/anxious.    All other systems reviewed and are negative.   Blood pressure 124/63, pulse 86, temperature 98.7 F (37.1 C), resp. rate 16, height 5\' 6"  (1.676 m), weight 55.8 kg (123 lb).Body mass index is 19.85 kg/m.  General Appearance: Fairly Groomed  Eye Contact:  Good  Speech:  Normal Rate  Volume:  Normal  Mood:  improved  Affect:  Appropriate, mildly anxious   Thought Process: linear   Orientation:  Full (Time, Place, and Person)  Thought Content:  Denies hallucinations,  not internally preoccupied at this time, does not feel threatened today.   Suicidal Thoughts:  No denies any suicidal ideations or self injurious ideations  Homicidal Thoughts:  No denies any homicidal or violent ideations- denies any plan or intention  of violence towards family members, states " they are my family, I do not want to hurt them"  Memory:   Recent and remote grossly intact   Judgement:  Fair  Insight:  Fair  Psychomotor Activity:  Normal  Concentration:  Concentration: Good and Attention Span: Good  Recall:  Good  Fund of Knowledge:  Good  Language:  Good  Akathisia:  No  Handed:    AIMS (if indicated):     Assets:  Communication Skills Desire for Improvement Resilience Social Support  ADL's:  Intact  Cognition:  WNL  Sleep:  Number of Hours: 5.5    Assessment - Patient much calmer. Not psychotic . Tolerating ativan today and anxiety is improved. Pulse is improved Treatment Plan Summary: Substance-induced psychotic disorder with delusions (HCC) Continue to encourage group and milieu participation to work on coping skills and symptom reduction  Continue to encourage abstinence/ recovery  from drugs and alcohol as part of treatment team Treatment team working on disposition planning   Medications:  -continue  Zyprexa to 5 mgrs  po qhs for psychosis -Vistaril 25 mgrs Q 6 hours PRN for anxiety Continue detox with ativan. Vitals improving  Possible discharge Monday morning .  He is informed to  tell family to access his guns and away from him prior discharge.  Says has no intent to harm .    Thresa Ross, MD 09/12/2016, 10:51 AM

## 2016-09-12 NOTE — Progress Notes (Signed)
Patient ID: Honor JunesKyaw Noell Iqbal, male   DOB: [birth year], 30 y.o.   MRN: [id number] D: Client visible on the unit, preoccupied with discharge. "I want to go home, apologize to my family, all a big mistake" "I know me I not hurt anybody, I'm a good person, want to go explain to my family" "It was all a misunderstanding"  Client is teary eyed "I tired to call they no answer phone" A: Writer provided emotional support, encouraged client to take medications, relax and speak with physician in the morning about discharge. Medications reviewed, administered as ordered "what I take" "I take same thing tonight" Writer explained one different medication from last night ordered for anxiety. Client initially held medications on tongue, but eventually swallowed, with mouth check. Staff will monitor q3415min for safety. R: Client is safe on the unit.

## 2016-09-12 NOTE — Progress Notes (Signed)
Patient cooperative with medication and is still very focussed on discharge. Spoke at length with MD requesting discharge, stating " I"m not crazy! " Patient states that his family is not answering the phone when he calls. P/C placed by this nurse and message left to return call. Patient denies SI, HI, AVH.

## 2016-09-12 NOTE — Progress Notes (Signed)
Family came in to get car keys in order to secure the firearms which are being stored in the car. Patient's car keys from storage locker given to wife.

## 2016-09-12 NOTE — [facility] Group Notes (Signed)
  [facility] LCSW Group Therapy Note   09/12/2016  10 to 10:45 AM   Type of Therapy and Topic: Group Therapy: Feelings Around Returning Home & Establishing a Supportive Framework and Activity to Identify signs of Improvement or Decompensation   Participation Level: Appropriate   Description of Group:  Patients first processed thoughts and feelings about up coming discharge. These included fears of upcoming changes, lack of change, new living environments, judgements and expectations from others and overall stigma of MH issues. We then discussed what is a supportive framework? What does it look like feel like and how do I discern it from and unhealthy non-supportive network? Learn how to cope when supports are not helpful and don't support you. Discuss what to do when your family/friends are not supportive.   Therapeutic Goals Addressed in Processing Group:  1. Patient will identify one healthy supportive network that they can use at discharge. 2. Patient will identify one factor of a supportive framework and how to tell it from an unhealthy network. 3. Patient able to identify one coping skill to use when they do not have positive supports from others. 4. Patient will demonstrate ability to communicate their needs through discussion and/or role plays.  Summary of Patient Progress:  Pt shares easily during group session but has difficulty relating to topic as evidenced by tangential remarks focused on discharge.. As patients processed their anxiety about discharge and described healthy supports patient reports his family as being strongest support Patient chose only visuals to represent improvement as "I don't want to talk about things not going well."  Carney Bernatherine C Harrill, LCSW

## 2016-09-12 NOTE — Progress Notes (Signed)
Pt was encouraged to attend the AA evening speaker meeting. Initially, pt stood at the nurses station trying to talk with staff and avoiding group, stating he already knew what the group was about. About half way through, pt went into the group room, however did not appear attentive and was sloughed in his chair. Caswell Corwinwen, Tenita Cue C, NT 09/12/16 10:13 PM

## 2016-09-13 LAB — PROLACTIN: Prolactin: 36.1 ng/mL — ABNORMAL HIGH (ref 4.0–15.2)

## 2016-09-13 MED ORDER — OLANZAPINE 5 MG PO TBDP
ORAL_TABLET | ORAL | Status: AC
  Filled 2016-09-13: qty 1

## 2016-09-13 MED ORDER — OLANZAPINE 5 MG PO TABS
5.0000 mg | ORAL_TABLET | Freq: Two times a day (BID) | ORAL | Status: DC
  Administered 2016-09-13 – 2016-09-14 (×2): 5 mg via ORAL
  Filled 2016-09-13: qty 1
  Filled 2016-09-13: qty 2
  Filled 2016-09-13 (×4): qty 1
  Filled 2016-09-13: qty 2
  Filled 2016-09-13: qty 1

## 2016-09-13 MED ORDER — OLANZAPINE 5 MG PO TBDP
5.0000 mg | ORAL_TABLET | Freq: Once | ORAL | Status: AC
  Administered 2016-09-13: 5 mg via ORAL
  Filled 2016-09-13: qty 1

## 2016-09-13 NOTE — Progress Notes (Signed)
[facility]  09/13/2016 11:07 AM Logan Branch  MRN:  [id number] Subjective:  Patient continues to focus on discharging . Denies medication side effects.  Objective: Pt seen and case reviewed with treatment team. I have seen patient along with RN. At patient's request attempted to call wife Meryle Ready/family- no answer. At his request , in his presence , I also called his pastor , who reported patient has been calling him frequently since admission . Renato Gailsastor states he has been in contact with family , and reports the following : Patient's parents in law have told pastor that they are afraid of patient returning home at this time, and that family has been working on alternative plans , they are hoping patient and wife will relocate to [place] , where patient's mother lives . As recently as two days ago, patient's parents in law reported to pastor that patient had still made some " veiled threats" during a phone communication with them . Patient has several firearms, which have been secured by family . Renato Gailsastor states that he is unsure how long patient has had paranoid ideations, behavioral changes , but attributes them most likely to recent escalation of drug abuse . Patient presents with some limited insight, he acknowledges above information, but states " so I am OK. I   can go home now, right?" . He denies any current suicidal or homicidal ideations, and states he does not want to hurt his parents in law or anyone else. Patient denies hallucinations, does not appear internally preoccupied, and is less focused on paranoid ideations. States " I just want to be with family again". On unit no overtly disruptive or agitated behaviors, but repeatedly at Nursing station requesting to call family, so they can pick him up for discharge.         Principal Problem: Substance-induced psychotic disorder with delusions (HCC) Diagnosis:   Patient Active Problem List   Diagnosis Date Noted  . Mood  disorder in conditions classified elsewhere [F06.30]   . Alcohol use disorder (HCC) [F10.99] 09/07/2016  . Polysubstance (excluding opioids) dependence (HCC) [F19.20] 09/07/2016  . Substance-induced psychotic disorder with delusions [facility](HCC) [F19.950] 09/07/2016   Total Time spent with patient: 25 minutes   Past Psychiatric History: see H&P  Past Medical History: History reviewed. No pertinent past medical history. History reviewed. No pertinent surgical history. Family History: History reviewed. No pertinent family history. Family Psychiatric  History: see H&P Social History:  History  Alcohol Use  . Yes     History  Drug Use  . Types: Marijuana, Methamphetamines    Social History   Social History  . Marital status: Married    Spouse name: N/A  . Number of children: N/A  . Years of education: N/A   Social History Main Topics  . Smoking status: Current Every Day Smoker    Packs/day: 0.50    Types: Cigarettes  . Smokeless tobacco: Never Used  . Alcohol use Yes  . Drug use:     Types: Marijuana, Methamphetamines  . Sexual activity: Not Asked   Other Topics Concern  . None   Social History Narrative  . None   Additional Social History:   Sleep: Good  Appetite:  Good  Current Medications: Current Facility-Administered Medications  Medication Dose Route Frequency Provider Last Rate Last Dose  . OLANZapine zydis (ZYPREXA) 5 MG disintegrating tablet           . acetaminophen (TYLENOL) tablet 650 mg  650 mg Oral  Q6H PRN Charm Rings, NP   650 mg at 09/10/16 2141  . alum & mag hydroxide-simeth (MAALOX/MYLANTA) 200-200-20 MG/5ML suspension 30 mL  30 mL Oral PRN Charm Rings, NP   30 mL at 09/12/16 1547  . hydrOXYzine (ATARAX/VISTARIL) tablet 25 mg  25 mg Oral Q6H PRN Craige Cotta, MD      . ibuprofen (ADVIL,MOTRIN) tablet 600 mg  600 mg Oral Q8H PRN Charm Rings, NP      . loperamide (IMODIUM) capsule 2-4 mg  2-4 mg Oral PRN Thresa Ross, MD      . LORazepam  (ATIVAN) tablet 1 mg  1 mg Oral Q6H PRN Thresa Ross, MD      . LORazepam (ATIVAN) tablet 1 mg  1 mg Oral BID Thresa Ross, MD   1 mg at 09/13/16 0809   Followed by  . [START ON 09/14/2016] LORazepam (ATIVAN) tablet 1 mg  1 mg Oral Daily Thresa Ross, MD      . magnesium hydroxide (MILK OF MAGNESIA) suspension 30 mL  30 mL Oral Daily PRN Charm Rings, NP      . multivitamin with minerals tablet 1 tablet  1 tablet Oral Daily Thresa Ross, MD   1 tablet at 09/13/16 0809  . nicotine polacrilex (NICORETTE) gum 2 mg  2 mg Oral PRN Kerry Hough, PA-C   2 mg at 09/12/16 2337  . OLANZapine (ZYPREXA) tablet 5 mg  5 mg Oral QHS Craige Cotta, MD   5 mg at 09/12/16 2159  . ondansetron (ZOFRAN) tablet 4 mg  4 mg Oral Q8H PRN Charm Rings, NP      . ondansetron (ZOFRAN-ODT) disintegrating tablet 4 mg  4 mg Oral Q6H PRN Thresa Ross, MD      . thiamine (B-1) injection 100 mg  100 mg Intramuscular Once Thresa Ross, MD      . thiamine (VITAMIN B-1) tablet 100 mg  100 mg Oral Daily Thresa Ross, MD   100 mg at 09/13/16 0808    Lab Results:  Results for orders placed or performed during the hospital encounter of 09/07/16 (from the past 48 hour(s))  Prolactin     Status: Abnormal   Collection Time: 09/12/16  6:49 AM  Result Value Ref Range   Prolactin 36.1 (H) 4.0 - 15.2 ng/mL    Comment: (NOTE) Performed At: [facility] [address] [place], [place] [id number] Mila Homer MD [id number] Performed at [facility]   Lipid panel     Status: None   Collection Time: 09/12/16  6:49 AM  Result Value Ref Range   Cholesterol 163 0 - 200 mg/dL   Triglycerides 92 <956 mg/dL   HDL 80 >21 mg/dL   Total CHOL/HDL Ratio 2.0 RATIO   VLDL 18 0 - 40 mg/dL   LDL Cholesterol 65 0 - 99 mg/dL    Comment:        Total Cholesterol/HDL:CHD Risk Coronary Heart Disease Risk Table                     Men   Women  1/2 Average Risk   3.4   3.3  Average Risk       5.0    4.4  2 X Average Risk   9.6   7.1  3 X Average Risk  23.4   11.0        Use the calculated Patient Ratio above and the CHD  Risk Table to determine the patient's CHD Risk.        ATP III CLASSIFICATION (LDL):  <100     mg/dL   Optimal  782-956  mg/dL   Near or Above                    Optimal  130-159  mg/dL   Borderline  213-086  mg/dL   High  >578     mg/dL   Very High Performed at [facility]     Blood Alcohol level:  Lab Results  Component Value Date   ETH 21 (H) 09/06/2016    Metabolic Disorder Labs: No results found for: HGBA1C, MPG Lab Results  Component Value Date   PROLACTIN 36.1 (H) 09/12/2016   Lab Results  Component Value Date   CHOL 163 09/12/2016   TRIG 92 09/12/2016   HDL 80 09/12/2016   CHOLHDL 2.0 09/12/2016   VLDL 18 09/12/2016   LDLCALC 65 09/12/2016    Physical Findings: AIMS: Facial and Oral Movements Muscles of Facial Expression: None, normal Lips and Perioral Area: None, normal Jaw: None, normal Tongue: None, normal,Extremity Movements Upper (arms, wrists, hands, fingers): None, normal Lower (legs, knees, ankles, toes): None, normal, Trunk Movements Neck, shoulders, hips: None, normal, Overall Severity Severity of abnormal movements (highest score from questions above): None, normal Incapacitation due to abnormal movements: None, normal Patient's awareness of abnormal movements (rate only patient's report): No Awareness, Dental Status Current problems with teeth and/or dentures?: No Does patient usually wear dentures?: No  CIWA:  CIWA-Ar Total: 0 COWS:  COWS Total Score: 2  Musculoskeletal: Strength & Muscle Tone: within normal limits Gait & Station: normal Patient leans: N/A  Psychiatric Specialty Exam: Physical Exam  HENT:  Head: Normocephalic.  Neurological: He is alert.  Skin: He is not diaphoretic.    Review of Systems  Cardiovascular: Negative for chest pain.  Gastrointestinal: Negative for nausea.   Psychiatric/Behavioral: Positive for depression and substance abuse. Negative for suicidal ideas. The patient is nervous/anxious.   All other systems reviewed and are negative.   Blood pressure 124/63, pulse 86, temperature 98.7 F (37.1 C), resp. rate 16, height 5\' 6"  (1.676 m), weight 55.8 kg (123 lb).Body mass index is 19.85 kg/m.  General Appearance: Fairly Groomed  Eye Contact:  Good  Speech:  Normal Rate  Volume:  Normal  Mood:  Denies depression   Affect:  Anxious, becomes irritable when reviewing rationale for admission, disposition planning   Thought Process: ruminations , concrete   Orientation:  Alert and attentive   Thought Content:  Denies hallucinations, does not appear internally preoccupied. No delusions expressed, but states " I had those guns for protection, did not want people to hurt me".  Suicidal Thoughts:  No denies any suicidal ideations or self injurious ideations  Homicidal Thoughts:  No denies any homicidal or violent ideations- denies any plan or intention of violence towards family members  Memory:   Recent and remote grossly intact   Judgement:  Fair  Insight:  Fair  Psychomotor Activity:  Normal  Concentration:  Concentration: Good and Attention Span: Good  Recall:  Good  Fund of Knowledge:  Good  Language:  Good  Akathisia:  No  Handed:    AIMS (if indicated):     Assets:  Communication Skills Desire for Improvement Resilience Social Support  ADL's:  Intact  Cognition:  WNL  Sleep:  Number of Hours: 5.75    Assessment - patient calmer  than on admission, and no overtly agitated or disruptive behaviors on unit. As above, collateral information source indicates patient may have recently still conveyed threats to his family members, which he denies doing . ( Currently denies any HI or violent ideations towards anyone). Report is that family continues to have concerns about their safety and do not want patient to return home at this time. Patient has  limited insight and seems to have difficulty processing this information, repeatedly stating he is OK and family should pick him up to go home. Although no overt psychotic symptoms, remains vaguely guarded , irritable .  Treatment Plan Summary: Substance-induced psychotic disorder with delusions (HCC) Continue to encourage group and milieu participation to work on coping skills and symptom reduction  Continue to encourage abstinence/ recovery  from drugs and alcohol as part of treatment team Treatment team working on disposition planning   Medications:  -increase  Zyprexa to 5 mgrs  po bid  for psychosis/ mood disorder -continue Vistaril 25 mgrs Q 6 hours PRN for anxiety -Continue Ativan taper Treatment team working on disposition planning      Nehemiah MassedOBOS, FERNANDO, MD 09/13/2016, 11:07 AM   Patient ID: Logan Branch, male   DOB: 1985-11-22, 30 y.o.   MRN: [id number]

## 2016-09-13 NOTE — Progress Notes (Signed)
Patient very fixated on discharging today, repeatedly stating that he needs to leave today to buy his wife a present and to say he is sorry to his wife's parents. Becomes angry when told that he will not be discharged today, stating that he has been told every day that he would be discharged the next day and then that changes. Does not acknowledge that he is continuously calling family and pastor and that his behavior is erratic and not calm and cooperative. Patient is angry and tearful. He insists that he has to go to work tomorrow despite the fact that the pastor stated that he had been fired from his job. Patient has taken offered medication without argument. Denies that he intends any harm toward anyone - denies that he has been threatening despite reports to the contrary from family and pastor. Denies suicidal ideation, denies auditory and visual hallucinations. Perseverates on discharge.

## 2016-09-13 NOTE — Progress Notes (Signed)
D   Pt is irritable and continues to request he be able to be discharged and doesn't understand why he is here   He has isolated to his room and has very minimal interaction with others A    Verbal support given   Medications administered and effectiveness monitored   Q 15 min checks R   Pt is safe at present time

## 2016-09-13 NOTE — Progress Notes (Signed)
Pt was invited to attend the evening AA speaker meeting. Pt was asleep at the time of group and stayed in his room in bed. Caswell Corwinwen, Pier Bosher C, NT 09/13/16 10:07 PM

## 2016-09-14 LAB — HEMOGLOBIN A1C
HEMOGLOBIN A1C: 5.4 % (ref 4.8–5.6)
MEAN PLASMA GLUCOSE: 108 mg/dL

## 2016-09-14 MED ORDER — HYDROXYZINE HCL 25 MG PO TABS: 25.0000 mg | ORAL_TABLET | Freq: Four times a day (QID) | ORAL | 0 refills | Status: AC | PRN

## 2016-09-14 MED ORDER — NICOTINE POLACRILEX 2 MG MT GUM: 2.0000 mg | CHEWING_GUM | OROMUCOSAL | 0 refills | Status: AC | PRN

## 2016-09-14 MED ORDER — THIAMINE HCL 100 MG PO TABS: 100.0000 mg | ORAL_TABLET | Freq: Every day | ORAL | 0 refills | Status: AC

## 2016-09-14 MED ORDER — OLANZAPINE 5 MG PO TABS: 5.0000 mg | ORAL_TABLET | Freq: Two times a day (BID) | ORAL | 0 refills | Status: AC

## 2016-09-14 NOTE — Progress Notes (Signed)
  [facility] Discharge Plan :  Will you be returning to the same living situation after discharge:  Yes,  home or with a friend if wife chooses to not allow pt to stay At discharge, do you have transportation home?: Yes,  CSW confirmed that pt's wife will pick him up today at 2pm Do you have the ability to pay for your medications: Yes,  BCBS'  Release of information consent forms completed and submitted to medical records by CSW.  Patient to Follow up at: Follow-up Information    [facility].   Specialty:  Behavioral Health Why:  Within 5 days of discharge, [facility] between 8AM and 3PM, to be assessed for whatever services you may need. Contact information: [address] [place] [address] [phone]           Next level of care provider has access to [facility] Link:no  Safety Planning and Suicide Prevention discussed: Yes,  SPE completed with pt's wife; SPI pamphlet and Mobile Crisis information provided to pt;  Have you used any form of tobacco in the last 30 days? (Cigarettes, Smokeless Tobacco, Cigars, and/or Pipes): Yes  Has patient been referred to the Quitline?: Patient refused referral  Patient has been referred for addiction treatment: Yes  Gaege Sangalang N Smart LCSW 09/14/2016, 10:52 AM

## 2016-09-14 NOTE — Progress Notes (Signed)
CSW met with pt regarding discharge. Pt states that his wife will be picking him up today and that he will be returning home with her. Pt gave CSW permission to call his wife to confirm. CSW contacted his wife 10:45 AM and she confirmed that she is picking him up at that he is returning home, rather than staying with a friend. She voiced no other concerns. Pt plans to follow-up at [facility] and has been given AA/NA list and Mental Health Association information. Pt also given SPE pamphlet and crisis hotline/Mobile Crisis information. He verbalized understanding of all information.  Maxie Better, MSW, LCSW Clinical Social Worker 09/14/2016 10:46 AM

## 2016-09-14 NOTE — Tx Team (Signed)
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 acetaminophen (TYLENOL) tablet 650 mg  650 mg Oral Q6H PRN Patrecia Pour, NP   650 mg at 09/10/16 2141  . alum & mag hydroxide-simeth (MAALOX/MYLANTA) 200-200-20 MG/5ML suspension 30 mL  30 mL Oral PRN Patrecia Pour, NP   30 mL at 09/12/16 1547  . hydrOXYzine (ATARAX/VISTARIL) tablet 25 mg  25 mg Oral Q6H PRN Jenne Campus, MD      . ibuprofen (ADVIL,MOTRIN) tablet 600 mg  600 mg Oral Q8H PRN Patrecia Pour, NP      . loperamide (IMODIUM) capsule 2-4 mg  2-4 mg Oral PRN Merian Capron, MD      . LORazepam (ATIVAN) tablet 1 mg  1 mg Oral Q6H PRN Merian Capron, MD   1 mg at 09/13/16 1306  . magnesium hydroxide (MILK OF MAGNESIA) suspension 30 mL  30 mL Oral Daily PRN Patrecia Pour, NP      . multivitamin with minerals tablet 1 tablet  1 tablet Oral Daily Merian Capron, MD   1 tablet at 09/14/16 0805  . nicotine polacrilex (NICORETTE) gum 2 mg  2 mg Oral PRN Laverle Hobby, PA-C   2 mg at 09/12/16 2337  . OLANZapine (ZYPREXA) tablet 5 mg  5 mg Oral BID Jenne Campus, MD   5 mg at 09/14/16 0806  . ondansetron (ZOFRAN) tablet 4 mg  4 mg Oral Q8H PRN Patrecia Pour, NP      . ondansetron (ZOFRAN-ODT) disintegrating tablet 4 mg  4 mg Oral Q6H PRN Merian Capron, MD      . thiamine (B-1) injection 100 mg  100 mg Intramuscular Once Merian Capron, MD      . thiamine (VITAMIN B-1) tablet 100 mg  100 mg Oral Daily Merian Capron, MD   100 mg at 09/14/16 0805   PTA Medications: No prescriptions prior to admission.    Patient Stressors: Substance abuse  Patient Strengths: Ability for insight Average or above average intelligence Physical Health Supportive  family/friends  Treatment Modalities: Medication Management, Group therapy, Case management,  1 to 1 session with clinician, Psychoeducation, Recreational therapy.   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  Therapeutic Interventions: 1 to 1 sessions, Unit Group sessions and Medication administration.  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 RN  will report any adverse and/or side effects to prescribing provider.  Therapeutic Interventions: 1 on 1 counseling sessions, Psychoeducation, Medication administration, Evaluate responses to treatment, Monitor vital signs and CBGs as ordered, Perform/monitor CIWA, COWS, AIMS and Fall Risk screenings as ordered, Perform wound care treatments as ordered.  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  Short Term Goals: Engage patient in aftercare planning with referrals and resources, Increase social support, Increase emotional regulation, Facilitate patient progression through stages of change regarding substance use diagnoses and concerns, Identify triggers associated with mental health/substance abuse issues and Increase skills for wellness and recovery  Therapeutic Interventions: Assess for all discharge needs, 1 to 1 time with Social worker, Explore available resources and support systems, Assess for adequacy in community support network, Educate family and significant other(s) on suicide prevention, Complete Psychosocial Assessment, Interpersonal group therapy.  Evaluation of Outcomes: Met   Progress in Treatment :  Attending groups: Yes  Participating in groups: YEs  Taking medication as prescribed: Yes Toleration medication: Yes  Family/Significant other contact made: CSW has contacted Logan Branch's wife; mother in Sports coach and pastor. At this point, Logan Branch's wife is CSW's main point of contact to avoid communication issues.   Patient understands diagnosis: Yes  Discussing patient identified problems/goals with staff: Yes  Medical problems stabilized or resolved: Yes  Denies suicidal/homicidal ideation: Yes, denies  Issues/concerns per patient self-inventory: None reported  Other: N/A  New problem(s) identified: None reported at this time    New Short Term/Long Term Goal(s): None at this time    Discharge Plan or Barriers: Patient plans to return home if his wife will allow and has a safe place to go if she chooses not to allow him home after all (friend's house). CSW spoke with wife this morning who confirmed that she is coming at 2pm to pick him up.  Logan Branch plans to followup at [facility] and has been given extensive resource list.    Reason for Continuation of Hospitalization: None  Estimated Length of Stay: d/c today   Attendees: Patient:     Physician: Dr. Parke Poisson, Dr. Modesta Messing MD 09/14/2016 10:52 AM     Nursing: Kieth Brightly RN; Britney RN 09/14/2016 10:52 AM   RN Care Manager:    Social Worker: Adriana Reams, [place], LCSW 09/14/2016 10:52 AM   Recreational Therapist:    Other: Haywood Lasso 09/14/2016 10:51 AM     Maxie Better, MSW, LCSW Clinical Social Worker 09/14/2016 10:52 AM

## 2016-09-14 NOTE — [facility] Suicide Risk Assessment (Signed)
[facility]   Principal Problem: Substance-induced psychotic disorder with delusions [facility](HCC) Discharge Diagnoses:  Patient Active Problem List   Diagnosis Date Noted  . 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 Alcohol use disorder (HCC) [F10.99] 09/07/2016  . Polysubstance (excluding opioids) dependence (HCC) [F19.20] 09/07/2016  . Substance-induced psychotic disorder with delusions [facility](HCC) [F19.950] 09/07/2016    Total Time spent with patient: 30 minutes  Musculoskeletal: Strength & Muscle Tone: within normal limits Gait & Station: normal Patient leans: N/A  Psychiatric Specialty Exam: Review of Systems  Psychiatric/Behavioral: Positive for substance abuse. Negative for depression, hallucinations and suicidal ideas. The patient is not nervous/anxious and does not have insomnia.     Blood pressure 106/63, pulse (!) 120, temperature 97.6 F (36.4 C), temperature source Oral, resp. rate 16, height 5\' 6"  (1.676 m), weight 123 lb (55.8 kg).Body mass index is 19.85 kg/m.  General Appearance: Fairly Groomed  Patent attorneyye Contact::  Good  Speech:  Clear and Coherent409  Volume:  Normal  Mood:  "good"  Affect:  Appropriate and Congruent  Thought Process:  Coherent  Orientation:  Full (Time, Place, and Person)  Thought Content:  Logical no paranoia, Perceptions: denies AH/VH  Suicidal Thoughts:  No  Homicidal Thoughts:  No  Memory:  Immediate;   Good Recent;   Good Remote;   Good  Judgement:  Fair  Insight:  Shallow  Psychomotor Activity:  Normal  Concentration:  Good  Recall:  Good  Fund of Knowledge:Good  Language: Good  Akathisia:  No  Handed:  Right  AIMS (if indicated):     Assets:  Desire for Improvement  Sleep:  Number of Hours: 5  Cognition: WNL  ADL's:  Intact   Mental Status Per Nursing Assessment::   On Admission:  NA  Demographic Factors:  NA  Loss Factors: NA  Historical Factors: Impulsivity  Risk Reduction  Factors:   Responsible for children under 28 years of age, Employed and Living with another person, especially a relative  Continued Clinical Symptoms:  Alcohol/Substance Abuse/Dependencies  Cognitive Features That Contribute To Risk:  None    Suicide Risk:  Mild:  Suicidal ideation of limited frequency, intensity, duration, and specificity.  There are no identifiable plans, no associated intent, mild dysphoria and related symptoms, good self-control (both objective and subjective assessment), few other risk factors, and identifiable protective factors, including available and accessible social support.  Follow-up Information    [facility] Follow up.   Specialty:  Behavioral Health Why:  Within 5 days of discharge, [facility] between 8AM and 3PM, to be assessed for whatever services you may need. Contact information: [address] [place] [address] [phone]          Patient seen, chart reviewed and case discussed with nursing staff, SWs and Dr. Jama Flavorsobos at the treatment meeting who have been involved in his care.   Patient discharge was once postponed due to reported threatening to his mother- in law, stating "she better watch her back." Please read the note written by Ms. Ambrose MantleMareida Grossman-Orr, LCSW on 09/12/2016 for details.  Patient adamantly denies making any threatening comments and he consistently denies any SI/HI since admission. Patient has not had any overtly disruptive behaviors or aggression, although is reported to have limited insight.   Today, he laughs and states that he made a comment of "if you don't trust me, I will burn your house" on weekend to his in-law. He states  that although he does not mean at all and denies any HI, he was frustrated as they did not come picking him up for discharge. He later agrees that it can impact their trust on him and states he would not do it again. He "love my family" and denies any HI or SI. He denies  paranoia anymore and feels safe to be discharged to home. He denies gun access. He agrees to continue his medication and has follow up appointment as above. He is hoping for sobriety. He is discharged to his home with his wife, who agrees with plans.   Plan Of Care/Follow-up recommendations:  Activity:  regular Diet:  regular Tests:  n/a Other:  n/a  Neysa Hottereina Tannia Contino, MD 09/14/2016, 10:45 AM

## 2016-09-14 NOTE — Progress Notes (Signed)
Discharge Note:  Patient discharged with family.  Patient denied SI and HI.  Denied A/V hallucinations.  Suicide prevention information given and discussed with patient who stated he understood and had no questions.  Patient stated he received all his belongings at discharge.  Patient stated he [facility].  All required discharge information given to patient at discharge.

## 2019-02-12 ENCOUNTER — Other Ambulatory Visit: Payer: Self-pay

## 2019-02-12 ENCOUNTER — Encounter (HOSPITAL_COMMUNITY): Payer: Self-pay | Admitting: *Deleted

## 2019-02-12 ENCOUNTER — Emergency Department (HOSPITAL_COMMUNITY)
Admission: EM | Admit: 2019-02-12 | Discharge: 2019-02-13 | Disposition: A | Discharge status: Home / Self Care | Payer: Self-pay | Attending: Emergency Medicine | Admitting: Emergency Medicine

## 2019-02-12 DIAGNOSIS — Z23 Encounter for immunization: Secondary | ICD-10-CM | POA: Insufficient documentation

## 2019-02-12 DIAGNOSIS — Y999 Unspecified external cause status: Secondary | ICD-10-CM | POA: Insufficient documentation

## 2019-02-12 DIAGNOSIS — F17210 Nicotine dependence, cigarettes, uncomplicated: Secondary | ICD-10-CM | POA: Insufficient documentation

## 2019-02-12 DIAGNOSIS — W450XXA Nail entering through skin, initial encounter: Secondary | ICD-10-CM | POA: Insufficient documentation

## 2019-02-12 DIAGNOSIS — Y929 Unspecified place or not applicable: Secondary | ICD-10-CM | POA: Insufficient documentation

## 2019-02-12 DIAGNOSIS — Y939 Activity, unspecified: Secondary | ICD-10-CM | POA: Insufficient documentation

## 2019-02-12 DIAGNOSIS — Z79899 Other long term (current) drug therapy: Secondary | ICD-10-CM | POA: Insufficient documentation

## 2019-02-12 DIAGNOSIS — S91331A Puncture wound without foreign body, right foot, initial encounter: Secondary | ICD-10-CM | POA: Insufficient documentation

## 2019-02-12 NOTE — ED Triage Notes (Addendum)
Pt says that he had on tennis shoes and stepped on a nail this afternoon, he reports he removed the nail. Puncture wound to plantar surface of the right foot, pain in the second and third toes. Unknown tetanus.

## 2019-02-13 ENCOUNTER — Emergency Department (HOSPITAL_COMMUNITY): Payer: Self-pay

## 2019-02-13 MED ORDER — TETANUS-DIPHTH-ACELL PERTUSSIS 5-2.5-18.5 LF-MCG/0.5 IM SUSP
0.5000 mL | Freq: Once | INTRAMUSCULAR | Status: AC
  Administered 2019-02-13: 0.5 mL via INTRAMUSCULAR
  Filled 2019-02-13: qty 0.5

## 2019-02-13 MED ORDER — CIPROFLOXACIN HCL 500 MG PO TABS: 500.0000 mg | ORAL_TABLET | Freq: Two times a day (BID) | ORAL | 0 refills | Status: AC

## 2019-02-13 NOTE — ED Notes (Signed)
Pt given crutches

## 2019-02-13 NOTE — ED Provider Notes (Addendum)
[facility] EMERGENCY DEPARTMENT Provider Note   CSN: [id number] Arrival date & time: 02/12/19  2204    History   Chief Complaint Chief Complaint  Patient presents with  . Foot Pain    HPI Logan Branch is a 33 y.o. male.     Patient presents with right foot puncture wound.  Patient stepped on a nail that punctured through the sole of his shoe and into his foot.  Unknown tetanus status.  Mild pain with walking.  No other injuries.  No fevers.     History reviewed. No pertinent past medical history.  Patient Active Problem List   Diagnosis Date Noted  . Mood disorder in conditions classified elsewhere   . Alcohol use disorder 09/07/2016  . Polysubstance (excluding opioids) dependence (HCC) 09/07/2016  . Substance-induced psychotic disorder with delusions (HCC) 09/07/2016    History reviewed. No pertinent surgical history.      Home Medications    Prior to Admission medications   Medication Sig Start Date End Date Taking? Authorizing Provider  ciprofloxacin (CIPRO) 500 MG tablet Take 1 tablet (500 mg total) by mouth 2 (two) times daily. 02/13/19   Blane Ohara, MD  hydrOXYzine (ATARAX/VISTARIL) 25 MG tablet Take 1 tablet (25 mg total) by mouth every 6 (six) hours as needed for anxiety. 09/14/16   Withrow, Everardo All, FNP  nicotine polacrilex (NICORETTE) 2 MG gum Take 1 each (2 mg total) by mouth as needed for smoking cessation. 09/14/16   Withrow, Everardo All, FNP  OLANZapine (ZYPREXA) 5 MG tablet Take 1 tablet (5 mg total) by mouth 2 (two) times daily. 09/14/16   Withrow, Everardo All, FNP  thiamine 100 MG tablet Take 1 tablet (100 mg total) by mouth daily. 09/15/16   Withrow, Everardo All, FNP    Family History No family history on file.  Social History Social History   Tobacco Use  . Smoking status: Current Every Day Smoker    Packs/day: 0.50    Types: Cigarettes  . Smokeless tobacco: Never Used  Substance Use Topics  . Alcohol use: Yes  . Drug use: Yes     Types: Marijuana, Methamphetamines     Allergies   Patient has no known allergies.   Review of Systems Review of Systems  Constitutional: Negative for fever.  Musculoskeletal: Negative for joint swelling.  Skin: Positive for wound.  Neurological: Negative for weakness.     Physical Exam Updated Vital Signs BP 139/82   Pulse 67   Temp 98.1 F (36.7 C) (Oral)   Resp 16   SpO2 99%   Physical Exam Vitals signs and nursing note reviewed.  HENT:     Head: Normocephalic.  Neck:     Musculoskeletal: Normal range of motion.  Cardiovascular:     Rate and Rhythm: Normal rate.  Pulmonary:     Effort: Pulmonary effort is normal.  Skin:    General: Skin is warm.     Comments: Patient has 2 mm puncture wound plantar aspect of right foot between second and third metatarsals.  No evidence of infection.  Mild tenderness to palpation.  Neurological:     General: No focal deficit present.     Mental Status: He is alert.  Psychiatric:        Mood and Affect: Mood normal.      ED Treatments / Results  Labs (all labs ordered are listed, but only abnormal results are displayed) Labs Reviewed - No data to display  EKG None  Radiology No results found.  Procedures Procedures (including critical care time)  Medications Ordered in ED Medications  Tdap (BOOSTRIX) injection 0.5 mL (0.5 mLs Intramuscular Given 02/13/19 0011)     Initial Impression / Assessment and Plan / ED Course  I have reviewed the triage vital signs and the nursing notes.  Pertinent labs & imaging results that were available during my care of the patient were reviewed by me and considered in my medical decision making (see chart for details).       Patient presents with puncture wound from a nail through sole of the shoe.  Discussed importance of cleaning, wound care in the ER and x-ray to ensure no metal fragments. Tetanus updated.   X-ray reviewed no foreign body visualized. Plan for oral  antibiotics and close outpatient follow-up with family practice. Crutches given for support and work note.   Final Clinical Impressions(s) / ED Diagnoses   Final diagnoses:  Puncture wound of plantar aspect of right foot, initial encounter    ED Discharge Orders         Ordered    ciprofloxacin (CIPRO) 500 MG tablet  2 times daily     02/13/19 0014           Blane OharaZavitz, Devlyn Parish, MD 02/13/19 0105    Blane OharaZavitz, Callyn Severtson, MD 02/13/19 [phone]

## 2019-02-13 NOTE — Discharge Instructions (Signed)
Soak your right foot in warm soap with water twice daily. Take antibiotics as discussed. Return to the emergency room if you develop spreading redness, pus draining, fevers or other concerns. Tylenol for pain.

## 2019-02-13 NOTE — ED Notes (Signed)
Pt transported to xray 

## 2019-02-13 NOTE — ED Notes (Signed)
Pt soaking foot in warm water and betadine x 10 mins.

## 2019-10-10 IMAGING — DX RIGHT FOOT COMPLETE - 3+ VIEW
3 series · 3 of 3 positions shown · non-contrast
Comparison: None.

CLINICAL DATA: Pain status post stepping on nail.

EXAM:
RIGHT FOOT COMPLETE - 3+ VIEW

[foot ap]
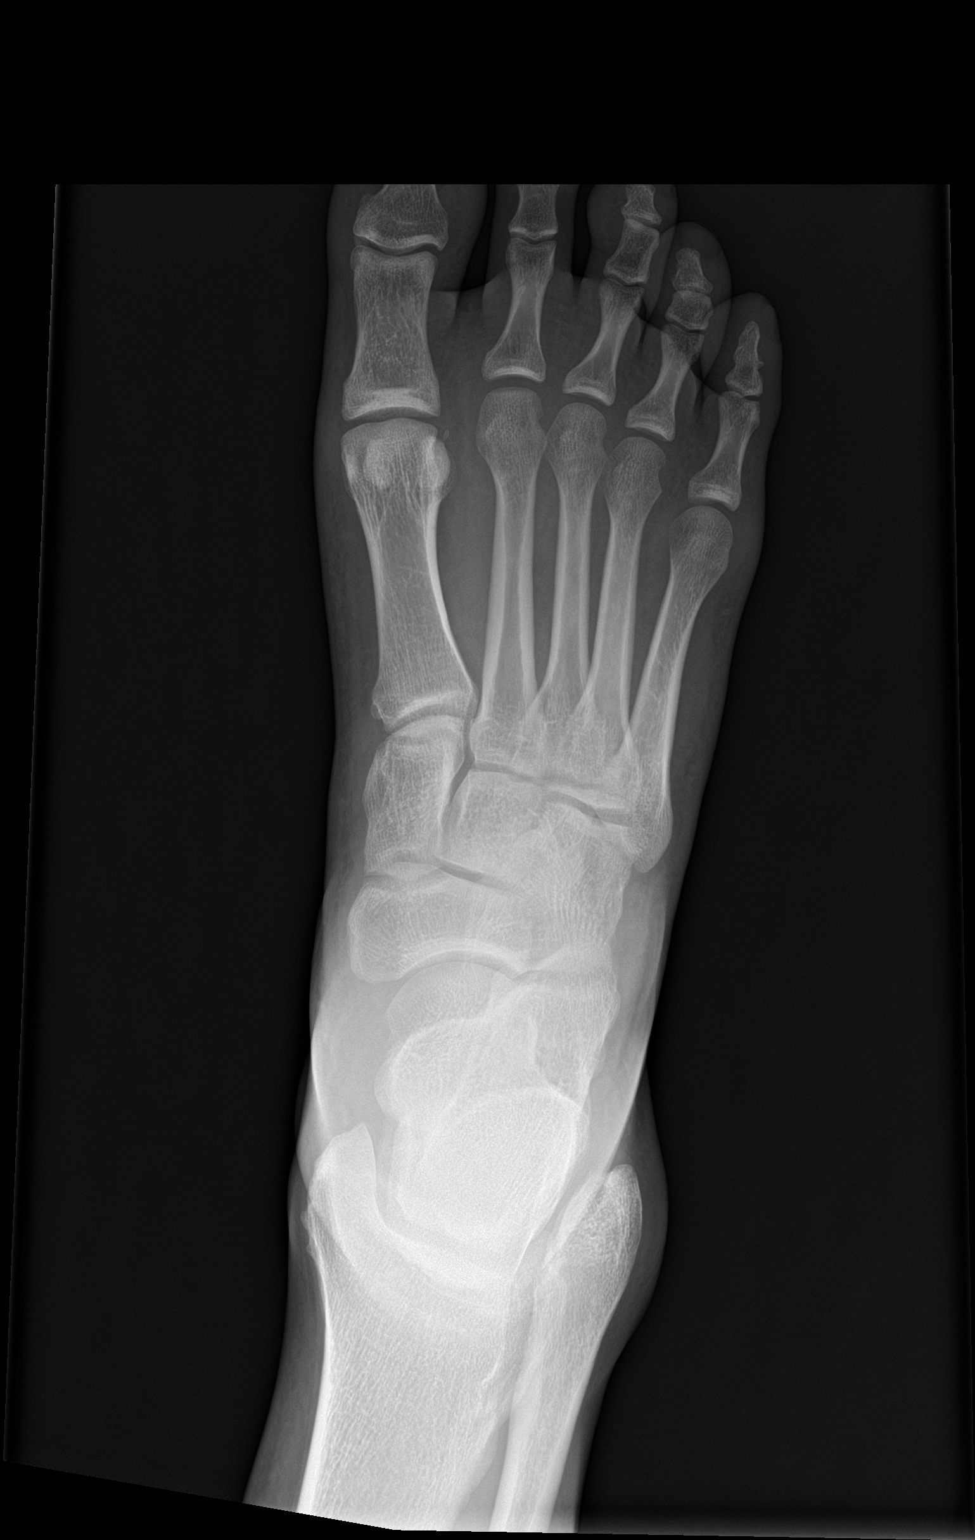

[foot obl]
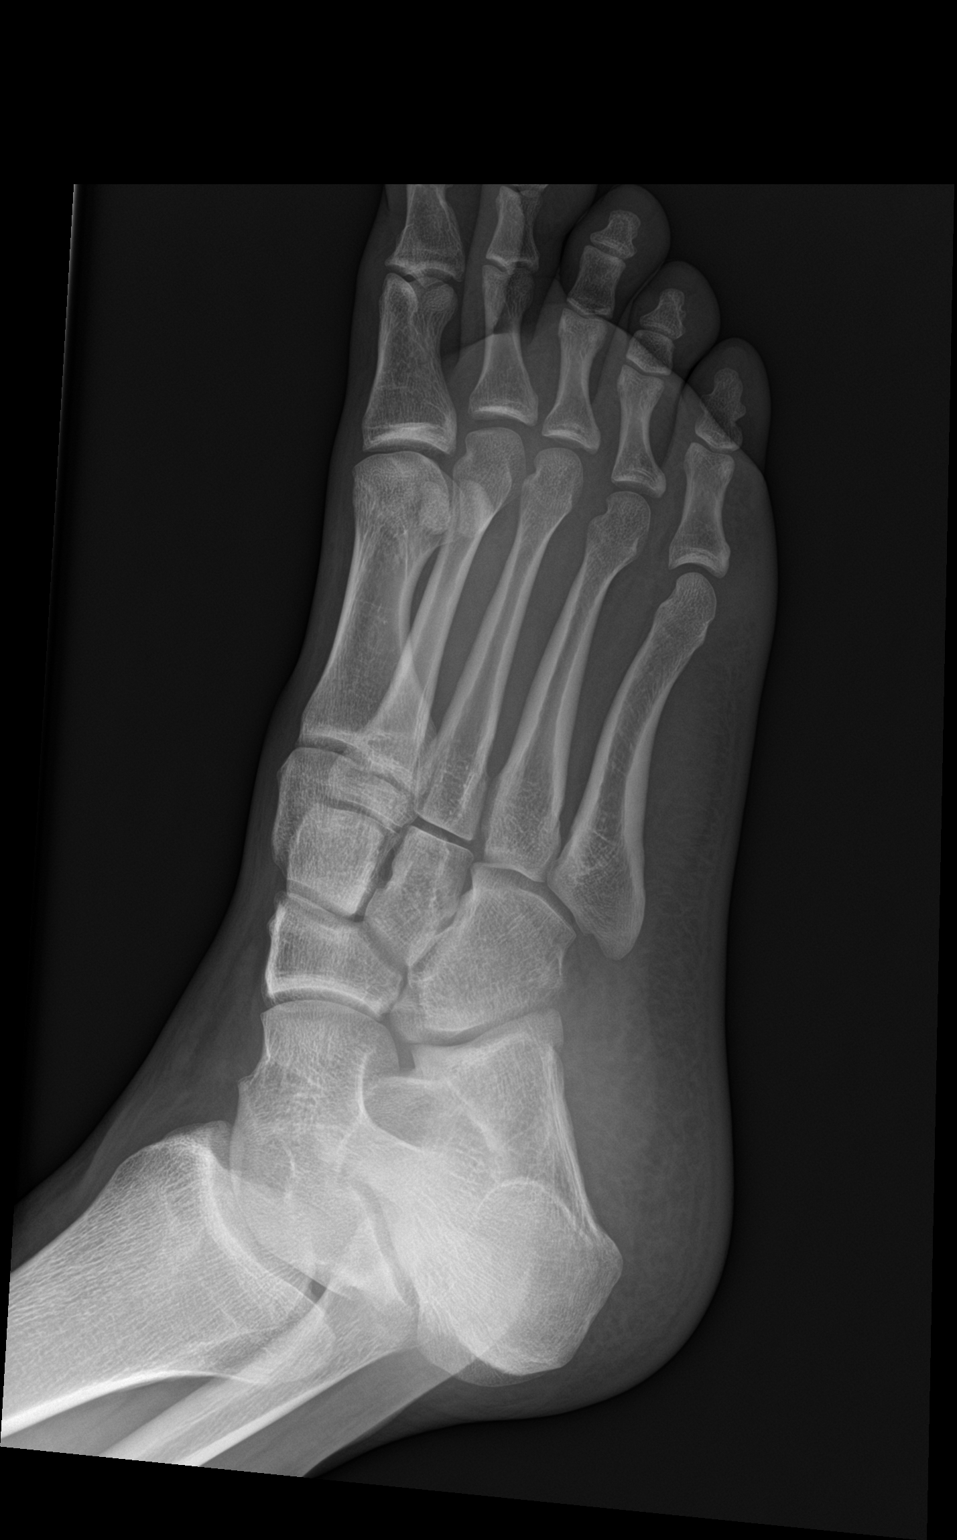

[foot lat]
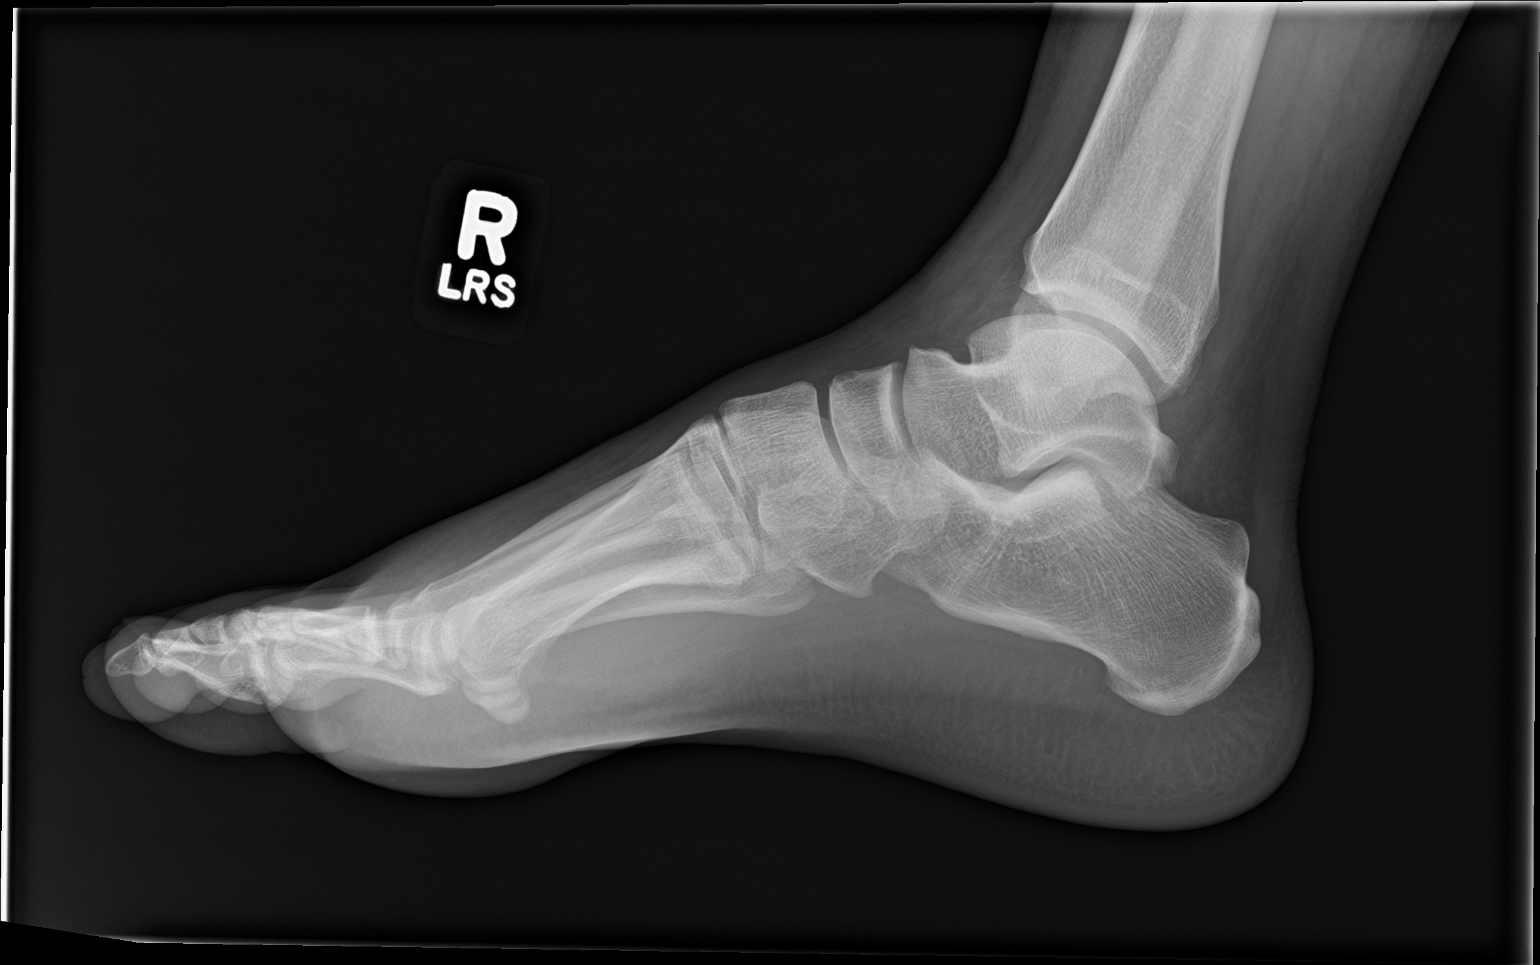

[3 of 3 positions shown; findings below may reference images not displayed]

FINDINGS: There is no evidence of fracture or dislocation. There is no
evidence of arthropathy or other focal bone abnormality. There is
some soft tissue swelling about the plantar aspect of the foot at
the level of the metatarsals.
IMPRESSION: 1. No acute displaced fracture or dislocation.
2. No radiopaque foreign body.
3. Soft tissue swelling about the plantar aspect of the foot.
